# Patient Record
Sex: Female | Born: 1945 | Race: White | Hispanic: No | Marital: Married | Smoking: Never smoker
Health system: Southern US, Community
[De-identification: ages and names within clinical notes are randomized; demographics above are authoritative.]

## PROBLEM LIST (undated history)

## (undated) DIAGNOSIS — K219 Gastro-esophageal reflux disease without esophagitis: Secondary | ICD-10-CM

## (undated) DIAGNOSIS — K449 Diaphragmatic hernia without obstruction or gangrene: Secondary | ICD-10-CM

## (undated) DIAGNOSIS — L409 Psoriasis, unspecified: Secondary | ICD-10-CM

## (undated) DIAGNOSIS — M5136 Other intervertebral disc degeneration, lumbar region: Secondary | ICD-10-CM

## (undated) DIAGNOSIS — I10 Essential (primary) hypertension: Secondary | ICD-10-CM

## (undated) DIAGNOSIS — M51369 Other intervertebral disc degeneration, lumbar region without mention of lumbar back pain or lower extremity pain: Secondary | ICD-10-CM

---

## 1998-06-27 ENCOUNTER — Encounter: Payer: Self-pay | Admitting: Emergency Medicine

## 1998-06-27 ENCOUNTER — Emergency Department (HOSPITAL_COMMUNITY): Admission: EM | Admit: 1998-06-27 | Discharge: 1998-06-27 | Payer: Self-pay | Admitting: *Deleted

## 1998-09-28 ENCOUNTER — Emergency Department (HOSPITAL_COMMUNITY): Admission: EM | Admit: 1998-09-28 | Discharge: 1998-09-28 | Payer: Self-pay | Admitting: Emergency Medicine

## 1999-01-09 ENCOUNTER — Emergency Department (HOSPITAL_COMMUNITY): Admission: EM | Admit: 1999-01-09 | Discharge: 1999-01-09 | Payer: Self-pay | Admitting: Emergency Medicine

## 1999-02-17 ENCOUNTER — Emergency Department (HOSPITAL_COMMUNITY): Admission: EM | Admit: 1999-02-17 | Discharge: 1999-02-17 | Payer: Self-pay | Admitting: Emergency Medicine

## 1999-02-17 ENCOUNTER — Encounter: Payer: Self-pay | Admitting: Emergency Medicine

## 1999-02-26 ENCOUNTER — Emergency Department (HOSPITAL_COMMUNITY): Admission: EM | Admit: 1999-02-26 | Discharge: 1999-02-26 | Payer: Self-pay | Admitting: Emergency Medicine

## 1999-02-26 ENCOUNTER — Encounter: Payer: Self-pay | Admitting: Emergency Medicine

## 1999-03-01 ENCOUNTER — Encounter: Admission: RE | Admit: 1999-03-01 | Discharge: 1999-03-01 | Payer: Self-pay | Admitting: Internal Medicine

## 1999-03-09 ENCOUNTER — Encounter: Admission: RE | Admit: 1999-03-09 | Discharge: 1999-03-09 | Payer: Self-pay | Admitting: Internal Medicine

## 1999-03-19 ENCOUNTER — Encounter: Payer: Self-pay | Admitting: Emergency Medicine

## 1999-03-19 ENCOUNTER — Emergency Department (HOSPITAL_COMMUNITY): Admission: EM | Admit: 1999-03-19 | Discharge: 1999-03-19 | Payer: Self-pay | Admitting: Emergency Medicine

## 1999-04-09 ENCOUNTER — Encounter: Payer: Self-pay | Admitting: Gastroenterology

## 1999-04-09 ENCOUNTER — Ambulatory Visit (HOSPITAL_COMMUNITY): Admission: RE | Admit: 1999-04-09 | Discharge: 1999-04-09 | Payer: Self-pay | Admitting: Gastroenterology

## 2004-07-28 ENCOUNTER — Ambulatory Visit (HOSPITAL_COMMUNITY): Admission: RE | Admit: 2004-07-28 | Discharge: 2004-07-28 | Payer: Self-pay | Admitting: Gastroenterology

## 2004-07-28 ENCOUNTER — Encounter (INDEPENDENT_AMBULATORY_CARE_PROVIDER_SITE_OTHER): Payer: Self-pay | Admitting: Specialist

## 2005-08-25 ENCOUNTER — Emergency Department (HOSPITAL_COMMUNITY): Admission: EM | Admit: 2005-08-25 | Discharge: 2005-08-25 | Payer: Self-pay | Admitting: Emergency Medicine

## 2005-10-17 ENCOUNTER — Encounter: Admission: RE | Admit: 2005-10-17 | Discharge: 2005-10-17 | Payer: Self-pay | Admitting: Sports Medicine

## 2005-11-03 ENCOUNTER — Encounter: Admission: RE | Admit: 2005-11-03 | Discharge: 2005-11-03 | Payer: Self-pay | Admitting: Sports Medicine

## 2006-03-11 ENCOUNTER — Emergency Department (HOSPITAL_COMMUNITY): Admission: EM | Admit: 2006-03-11 | Discharge: 2006-03-12 | Payer: Self-pay | Admitting: Emergency Medicine

## 2006-03-15 ENCOUNTER — Emergency Department (HOSPITAL_COMMUNITY): Admission: EM | Admit: 2006-03-15 | Discharge: 2006-03-15 | Payer: Self-pay | Admitting: Emergency Medicine

## 2006-10-13 ENCOUNTER — Emergency Department (HOSPITAL_COMMUNITY): Admission: EM | Admit: 2006-10-13 | Discharge: 2006-10-14 | Payer: Self-pay | Admitting: Emergency Medicine

## 2006-10-18 ENCOUNTER — Ambulatory Visit: Payer: Self-pay | Admitting: Internal Medicine

## 2006-11-03 ENCOUNTER — Ambulatory Visit: Payer: Self-pay | Admitting: Internal Medicine

## 2006-11-17 ENCOUNTER — Ambulatory Visit: Payer: Self-pay | Admitting: Pulmonary Disease

## 2006-12-07 ENCOUNTER — Ambulatory Visit: Payer: Self-pay | Admitting: Internal Medicine

## 2006-12-08 ENCOUNTER — Ambulatory Visit: Payer: Self-pay | Admitting: Cardiology

## 2007-07-17 IMAGING — CR DG CHEST 2V
2 series · 2 of 2 positions shown · non-contrast
Comparison: none

CLINICAL DATA: 59-year-old female, cough.  Burning midchest. 
 CHEST - 2 VIEW:

[w chest pa]
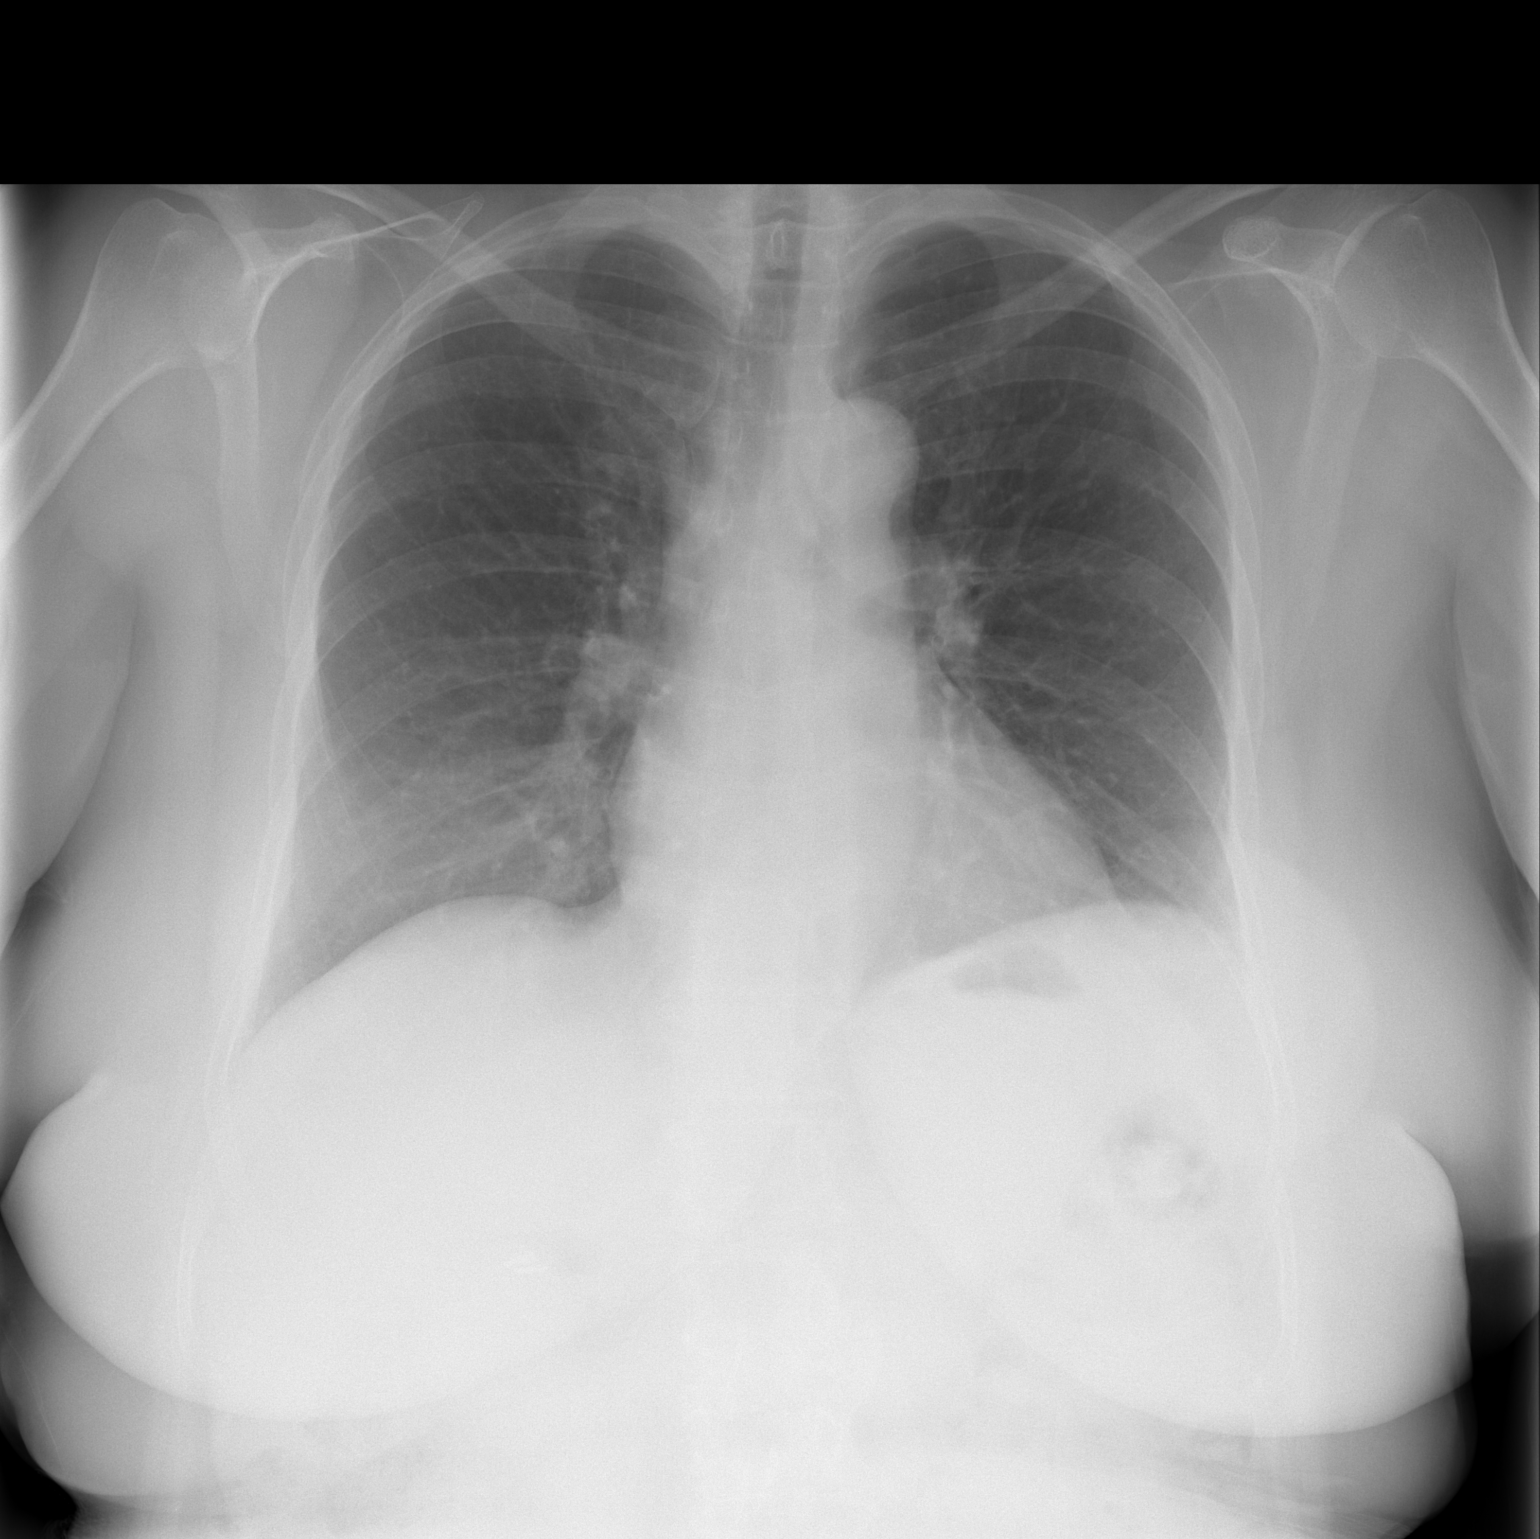

[w chest lat]
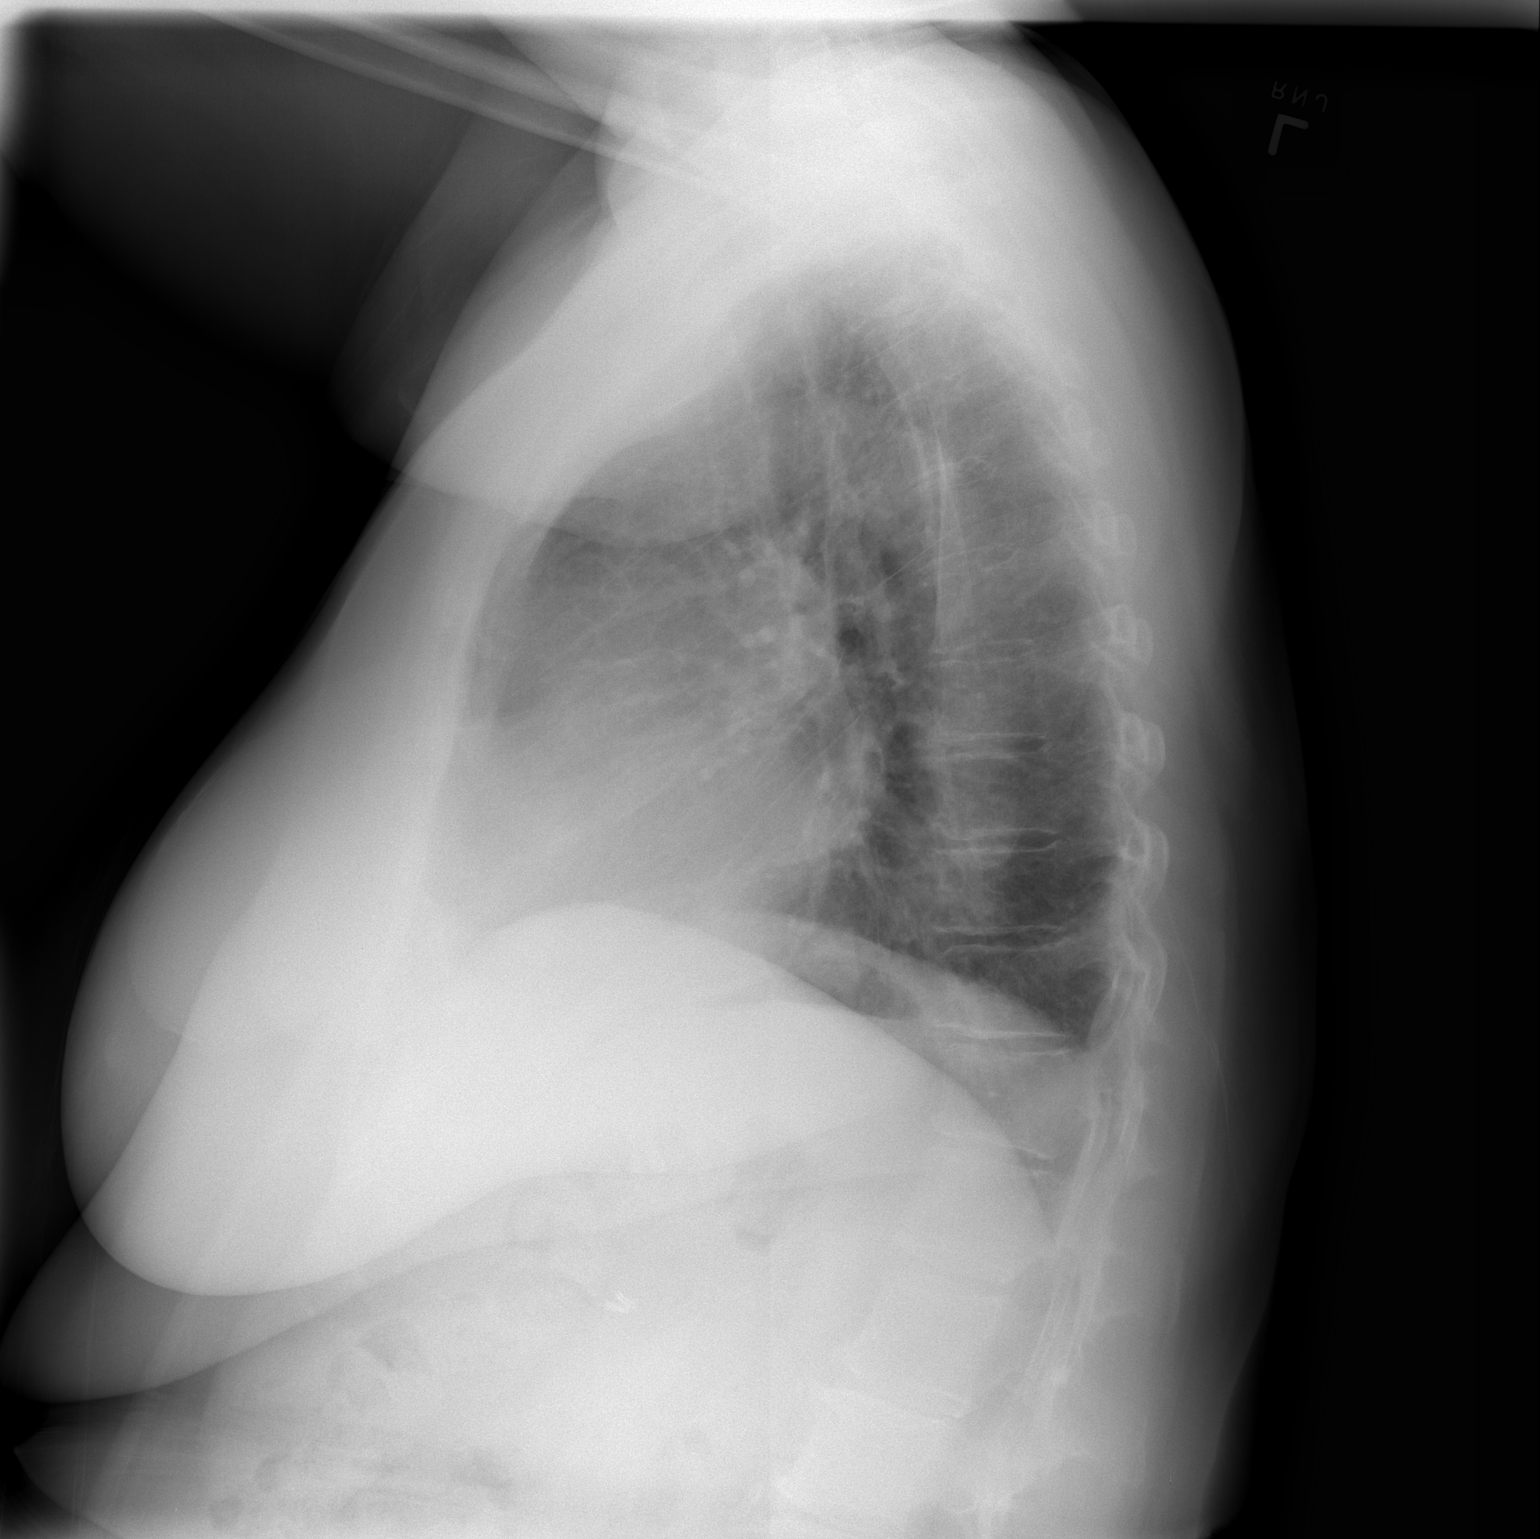

[2 of 2 positions shown; findings below may reference images not displayed]

FINDINGS: Cardiopericardial silhouette is within normal limits in size.  Lungs are clear.  The visualized soft tissues and bony thorax are unremarkable apart from mild degenerative changes of the thoracic spine.  
 Surgical clips are noted in the right upper quadrant.
IMPRESSION: No acute cardiopulmonary disease.

## 2010-11-16 NOTE — Assessment & Plan Note (Signed)
[facility]                             PULMONARY OFFICE NOTE   KYNZEE, DEVINNEY                      MRN:          [id number]  DATE:12/07/2006                            DOB:          11/04/1945    HISTORY OF PRESENT ILLNESS:  The patient is a 65 year old female patient  of Dr. Thurston Hole who has a known history of chronic cough.  The patient,  last visit, had been felt to have some vocal cord dysfunction with  persistent cough, wheezing and shortness of breath.  She had been  recommended to use Nexium twice a day, a Medrol Dosepak and cough  suppression regimen with Mucinex DM and Mepergan Forte.  However, she as  unable to tolerate the Mucinex and Mepergan or Medrol, only being able  to take a few days of this.  The patient has had no improvement in  symptoms.  The patient also has a history of psoriasis and  osteoarthritis.  The patient denies any fever, chest pain purulent  sputum or leg swelling.   PAST MEDICAL HISTORY:  Reviewed.   CURRENT MEDICATIONS:  Reviewed.   PHYSICAL EXAMINATION:  GENERAL:  The patient is a pleasant female in no  acute distress.  She is afebrile with stable vital signs.  Oxygen  saturation is 98% on room air.  HEENT:  Unremarkable.  NECK:  Supple without cervical adenopathy.  No JVD.  LUNGS:  Coarse breath sounds.  CARDIAC:  Regular rate.  ABDOMEN:  Soft and nontender.  EXTREMITIES:  Warm without any edema.  The patient does have significant  psoriasis along the upper extremities, especially at the elbows.   IMPRESSION/PLAN:  1. Recurrent cough, wheezing and shortness of breath.  The patient is      scheduled for CT of the sinuses along with labs with CBC with      differential, sed rate, BNP and C-met along with the RA factor.      The patient will return here next week with Dr. Sherene Sires to review      these CT scan and labs.  2. Complex medication regimen.  The patient's medication reviewed in      detail.   Patient      education provided.  A computerized medication calendar was      completed for this.  Patient reviewed in detail.  Patient aware to      bring this back to each and every visit.      Rubye Oaks, NP  Electronically Signed      Charlaine Dalton. Sherene Sires, MD, [facility]  Electronically Signed   TP/MedQ  DD: 12/07/2006  DT: 12/08/2006  Job #: [id number]

## 2010-11-16 NOTE — Assessment & Plan Note (Signed)
[facility]                             PULMONARY OFFICE NOTE   Eileen Nguyen, Eileen Nguyen                      MRN:          [id number]  DATE:11/03/2006                            DOB:          1946-02-15    This is a 65 year old woman who initially told me she was sick for 5 or  10 years. Turns out, more like 20 today, with a history of coughing,  wheezing and dyspnea that only improves with albuterol and frequently  exacerbates at night.  Interestingly, prednisone does no good.  She  described the cough previously as dry but since her last visit states it  has become productive of thick yellow sputum especially at night.   She had been seen on April 16 with atypical symptoms for which I felt  the upper way as the source with GERD but contemplated that possibly she  had asthma as well, based on how much her symptoms improved on  albuterol.  I introduced her to the use of Symbicort 80/4.5 2 puffs  b.i.d. and spent some time teaching her how to use it effectively (I  documented 50% effectiveness at the end of the training session).  She  comes back today stating she is no better and that she is short of  breath just walking 50 feet.   The one item I did not know on her previous visit is that she is  convinced she was on ACE inhibitors for antihypertensive. It turns out  she was on high doses of beta blockers in the form of Metoprolol as her  antihypertensive for which I asked her to decrease the Metoprolol 100 mg  tablets to 1/2 b.i.d. and return here today for followup.   PHYSICAL EXAMINATION:  She is a somber ambulatory white female with a  peculiar affect and attitude, has a difficult time answering questions  in a straight forward fashion and appears both helpless and hopeless  regarding her problems.  HEENT:  Unremarkable. Pharynx is clear.  NECK:  Supple without cervical adenopathy or tenderness. Trachea is  midline, no thyromegaly.  LUNG  FIELDS:  Reveal pseudo wheeze only.  CARDIAC:  There is a regular rate and rhythm without murmur, gallop,  rub.  ABDOMEN:  Soft, benign.  EXTREMITIES:  Warm without calf tenderness, no clubbing, cyanosis or  edema.   A chest x-ray was reviewed from October 14, 2006 and is normal.   IMPRESSION:  1. Vocal cord dysfunction of unclear etiology (differential diagnosis      includes sinusitis, reflux and functional, with functional being      much more likely given the fact that she now says she has had the      problem for over 20 years) and it never gets better except with      albuterol and it only gets better some then.  The first thing I      did was to make sure she could actually use albuterol and found out      that she probably inhales only 10% of the medicine, most of it  ending up in the mouth.   Therefore I am less convinced now than I was previously that asthma is  the diagnosis and recommended the following specifics today, spending 25  minutes with her going over information in a carefully texted written  format.   1. Change Metoprolol to Bystolic 10 mg one daily on the basis of the      possibility that high does of Metoprolol are causing nocturnal      dyspnea/wheeze  (that remains to be seen but certainly I have the samples to give her a  therapeutic trial for this and it is worth attempting).  1. Nexium 40 mg tablets 30 minutes a.c. b.i.d.  2. Medrol 4 mg 4 daily until 100% better, then 2 daily x3 days, then 1      daily x3 days and stop.  3. For dyspnea I want her to use albuterol in the form of Pro Air and      spent extra time teaching her how to use it to better effect (only      approaching 50% effectiveness at the end of teaching session      however).  4. For cough and congestion I want her to use Mucinex DM 1-2 q.12      hours, if still coughing add Mepergan one q.4 hours.   Since she does have purulent sputum now,  sinusitis is also a  possibility, so I  have recommended Augmentin 875 b.i.d. x10 days and  then for cough or sinus, CT scan if not convinced that she is improving  in terms of the purulence of the sputum.   If at any point her condition worsens during the next 2 weeks I have  offered to put her in the hospital since I still do not have a firm  handle on the nature of this illness going back 20 years, and it seems  to be if anything, getting worse with empiric treatment directed at  asthma.     Charlaine Dalton. Sherene Sires, MD, [facility]  Electronically Signed    MBW/MedQ  DD: 11/04/2006  DT: 11/04/2006  Job #: [id number]   cc:   Windle Guard, M.D.

## 2010-11-16 NOTE — Assessment & Plan Note (Signed)
 [facility]                             PULMONARY OFFICE NOTE   AVAYA, MCJUNKINS                      MRN:          [id number]  DATE:11/17/2006                            DOB:          04/14/1946    HISTORY OF PRESENT ILLNESS:  The patient is a 65 year old, white female  patient of Dr. Sherene Sires who returns today for a 2-week followup.  The  patient was recently seen by Dr. Sherene Sires for pulmonary consult with  persistent cough and wheezing.  At last visit, the patient was  recommended to change Metoprolol over to Bistolic and to increase Nexium  up to twice a day.  The patient was started on a Medrol dose pack and  then recommended to use for cough and congestion Mucinex DM with  Mepergan Forte to be used every 4 hours for breakthrough coughing.  She  was also given a 10-day course of Augmentin.  The patient returns today  with only minimal improvement in symptoms.  The patient did become a  little confused with the above medication recommendations.  The patient  does have 2 days left of her Augmentin and did not take the Medrol  except for 4 mg a day.  The patient also has not used any Mepergan or  Mucinex DM for cough control.  The patient denies any chest pain,  orthopnea, PND or leg swelling.   PAST MEDICAL HISTORY:  Reviewed.   CURRENT MEDICATIONS:  Reviewed.   PHYSICAL EXAMINATION:  GENERAL:  The patient is a pleasant female in no  acute distress.  VITAL SIGNS:  Afebrile with stable vital signs.  O2 saturation 96% on  room air.  HEENT:  Nasal mucosa pale with some nonspecific turbinate edema.  Posterior pharynx is clear.  NECK:  Supple without cervical adenopathy.  No JVD.  LUNGS:  Lung sounds reveal course breath sounds bilaterally with no  wheeze or crackles.  CARDIAC:  Regular rate.  ABDOMEN:  Soft and nontender.  EXTREMITIES:  Without any edema.   ASSESSMENT/PLAN:  Vocal cord dysfunction secondary to possible  underlying sinusitis  and/or reflux possibly.  The patient will finish  Augmentin as recommended.  Will restart a Medrol dose pack beginning  with 16 mg x3 days, decreasing by 4 mg every 3 days and then stop.  The  patient will also add in Mucinex DM twice daily and may use Mepergan  Forte for breakthrough coughing.  She will return here in 1 week for a  complete medication review in which she will bring all of her  medications and we will give her a computerized medication list to help  her with  her difficult medication list.  The patient is recommended to contact us  for sooner followup if needed.      Rubye Oaks, NP  Electronically Signed      Charlaine Dalton. Sherene Sires, MD, [facility]  Electronically Signed   TP/MedQ  DD: 11/20/2006  DT: 11/21/2006  Job #: [id number]

## 2010-11-19 NOTE — Op Note (Signed)
NAMEPAVNEET, MARKWOOD NO.:  [id number]   MEDICAL RECORD NO.:  [id number]          PATIENT TYPE:  AMB   LOCATION:  ENDO                         FACILITY:  [facility]   PHYSICIAN:  Anselmo Rod, M.D.  DATE OF BIRTH:  [birth year]   DATE OF PROCEDURE:  07/28/2004  DATE OF DISCHARGE:                                 OPERATIVE REPORT   PROCEDURE PERFORMED:  Colonoscopy with cold biopsies x 1.   ENDOSCOPIST:  Anselmo Rod, M.D.   INSTRUMENT USED:  Olympus video colonoscope.   INDICATIONS FOR PROCEDURE:  A 65 year old white female undergoing screening  colonoscopy to rule out colonic polyps, masses, etc.  Patient has a personal  history of colonic polyps.   PREPROCEDURE PREPARATION:  Informed consent was procured from the patient.  The patient fasted for 8 hours prior to the procedure and prepped with a  bottle of magnesium citrate and a gallon of GoLYTELY the night prior to the  procedure.  Risks and benefits of the procedure including a 10% missed rate  of cancer and polyps were discussed with her as well.   PREPROCEDURE PHYSICAL EXAMINATION:  VITAL SIGNS:  The patient with stable  vital signs.  NECK:  Supple.  CHEST:  Clear to auscultation.  CARDIAC:  S1, S2, regular.  ABDOMEN:  Soft with normal bowel sounds.   DESCRIPTION OF PROCEDURE:  The patient was placed in the left lateral  decubitus position, sedated with 70 mg of Demerol and 7 mg of Versed in  slow, incremental doses.  Once the patient was adequately sedated and  maintained on low-flow oxygen and continuous cardiac monitoring, the Olympus  video colonoscope was advanced from the rectum to the cecum.  The  appendiceal orifice and ileocecal valve were visualized and photographed.  A  small sessile polyp was biopsied from 90 cm.  Small internal hemorrhoids  were seen on retroflexion.  The patient tolerated the procedure well without  complications.   IMPRESSION:  1.  Small, nonbleeding internal  hemorrhoids.  2.  Small sessile polyp, biopsied from 90 cm.  3.  No evidence of diverticulosis.  4.  No large masses or polyps seen.   RECOMMENDATIONS:  1.  Await pathology results.  2.  Avoid nonsteroidals, including aspirin for the next 2 weeks.  3.  Repeat colonoscopy depending on colonoscopy results.  4.  Outpatient follow up as need arises in the future.      JNM/MEDQ  D:  07/28/2004  T:  07/28/2004  Job:  [id number]   cc:   Richardean Sale, M.D.  [address]  [place]  [place] [address]  Fax: [phone]

## 2010-11-19 NOTE — Assessment & Plan Note (Signed)
[facility]                             PULMONARY OFFICE NOTE   SIEARA, BREMER                      MRN:          [id number]  DATE:10/18/2006                            DOB:          22-Oct-1945    CHIEF COMPLAINT:  Coughing and wheezing.   HISTORY:  A 65 year old, white female states she has been having trouble  that comes and goes for 5 or 10 years. From that point, the history  went down hill in terms of anything specific that bothered her. As the  best I understood it, she has been having a sensation of chest tightness  associated with cough but no significant dyspnea that comes and goes  without any pattern. It goes away for a month and then comes back for a  month and then goes away without any identifiably relieving medicine. On  the other hand, albuterol helps a lot.   She has already been seen by Dr. Ezzard Standing during one of her bad spells  and it was reported that there were no abnormalities. The pattern does  occur at night while sleeping. In fact, she took her last dose of  albuterol at 4:00 in the morning. She had been on recent prednisone and  says it did not do any good.  The patient denies any significant  dyspnea, pleuritic pain, fevers, chills, sweats, orthopnea, PND or leg  swelling. She does have chronic acid reflux symptoms and correlates this  with her complaints and denies any typical seasonal or weather related  triggers.   PAST MEDICAL HISTORY:  Significant for low back pain. She has a history  of GERD requiring esophageal stretching although does not know who did  it or how long ago it was.   ALLERGIES:  CODEINE.   MEDICATIONS:  Metoprolol 100 mg daily. Glucosamine, fish oil, Nexium,  Lidoderm and Lyrica.   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   FAMILY HISTORY:  Positive for heart disease in her father, negative  for  respiratory diseases or atopy.   REVIEW OF SYSTEMS:  Taken in detail on the worksheet and negative except  as outlined above.   PHYSICAL EXAMINATION:  GENERAL:  This is a pleasant if extremely  evasive, ambulatory, white female in no acute distress who did not  answer a single question I asked her in a straight forward manner.  VITAL SIGNS:  She had stable vital signs.  HEENT:  Unremarkable. Pharynx clear.  NECK:  Supple without cervical adenopathy or tenderness. The trachea is  midline, no thyromegaly.  LUNGS:  Lung fields perfectly clear bilaterally to auscultation and  percussion.  HEART:  There is a regular rate and rhythm without murmur, gallop or  rub.  ABDOMEN:  Soft and benign.  EXTREMITIES:  Warm without calf tenderness, cyanosis, clubbing or edema.   IMPRESSION:  Symptoms dating back 10 years that are extremely vague in  nature but do improve with albuterol though she does not consider it a  medication and initially gave it no credit at all for helping her.  Since she is so convinced she is better from it, I am going to recommend  a trial of Symbicort 80/4.5 two puffs b.i.d. and spent extra time making  sure she could use metered dose inhaler effectively (she was only  however about 50% effective at the end of the training session).   What this symptom sounds more like to me is upper airway dysfunction  masquerading as asthma and a unifying diagnosis may be poorly controlled  reflux, although I do note that she is taking Nexium pretty  consistently.   It does turn out that reflux can cause both atypical and typical asthma  and therefore I recommended the following:   1. I reviewed with her an optimal gastroesophageal reflux disease      diet.  2. Take Nexium 30-60 minutes before both breakfast and supper just for      the next several weeks. If she is wheezing, use albuterol 2 puffs      every 4 hours, if coughing and congestion use Delsym 2 teaspoons       every 12 hours and if still having cough or discomfort related to      cough use Tramadol 50 mg 1 every 4 hours.   Because her symptoms seem so difficult to get a handle on, I have  recommended return here within 2 weeks and would consider switching from  nonspecific metoprolol to more specific Bystolic in terms of treating  her blood pressure, especially if high dose beta blocker is required,  and eliminating fish oil completely since oil is probably the second  worst thing she could refluxing after acid.   Chest x-ray is reviewed for October 14, 2006 and is normal.     Casimiro Needle B. Sherene Sires, MD, [facility]  Electronically Signed    MBW/MedQ  DD: 10/18/2006  DT: 10/19/2006  Job #: [id number]   cc:   Windle Guard, M.D.

## 2011-03-11 ENCOUNTER — Emergency Department (HOSPITAL_COMMUNITY): Payer: Self-pay

## 2011-03-11 ENCOUNTER — Emergency Department (HOSPITAL_COMMUNITY)
Admission: EM | Admit: 2011-03-11 | Discharge: 2011-03-11 | Disposition: A | Discharge status: Home / Self Care | Payer: Self-pay | Attending: Emergency Medicine | Admitting: Emergency Medicine

## 2011-03-11 DIAGNOSIS — M79609 Pain in unspecified limb: Secondary | ICD-10-CM | POA: Insufficient documentation

## 2011-03-11 DIAGNOSIS — I10 Essential (primary) hypertension: Secondary | ICD-10-CM | POA: Insufficient documentation

## 2011-03-11 DIAGNOSIS — S61409A Unspecified open wound of unspecified hand, initial encounter: Secondary | ICD-10-CM | POA: Insufficient documentation

## 2011-03-11 DIAGNOSIS — IMO0001 Reserved for inherently not codable concepts without codable children: Secondary | ICD-10-CM | POA: Insufficient documentation

## 2011-05-04 ENCOUNTER — Emergency Department (HOSPITAL_COMMUNITY): Payer: Self-pay

## 2011-05-04 ENCOUNTER — Emergency Department (HOSPITAL_COMMUNITY)
Admission: EM | Admit: 2011-05-04 | Discharge: 2011-05-05 | Disposition: A | Discharge status: Home / Self Care | Payer: Self-pay | Attending: Emergency Medicine | Admitting: Emergency Medicine

## 2011-05-04 DIAGNOSIS — R51 Headache: Secondary | ICD-10-CM | POA: Insufficient documentation

## 2011-05-04 DIAGNOSIS — R21 Rash and other nonspecific skin eruption: Secondary | ICD-10-CM | POA: Insufficient documentation

## 2011-05-04 DIAGNOSIS — R1310 Dysphagia, unspecified: Secondary | ICD-10-CM | POA: Insufficient documentation

## 2011-05-04 DIAGNOSIS — R000 Tachycardia, unspecified: Secondary | ICD-10-CM | POA: Insufficient documentation

## 2011-05-04 DIAGNOSIS — R1013 Epigastric pain: Secondary | ICD-10-CM | POA: Insufficient documentation

## 2011-05-04 DIAGNOSIS — I10 Essential (primary) hypertension: Secondary | ICD-10-CM | POA: Insufficient documentation

## 2011-05-04 DIAGNOSIS — R110 Nausea: Secondary | ICD-10-CM | POA: Insufficient documentation

## 2011-05-04 DIAGNOSIS — R079 Chest pain, unspecified: Secondary | ICD-10-CM | POA: Insufficient documentation

## 2011-05-04 LAB — URINALYSIS, ROUTINE W REFLEX MICROSCOPIC
Bilirubin Urine: NEGATIVE
Glucose, UA: NEGATIVE mg/dL
Ketones, ur: NEGATIVE mg/dL
Nitrite: NEGATIVE
Protein, ur: NEGATIVE mg/dL
Specific Gravity, Urine: 1.013 (ref 1.005–1.030)
Urobilinogen, UA: 0.2 mg/dL (ref 0.0–1.0)
pH: 7 (ref 5.0–8.0)

## 2011-05-04 LAB — CBC
HCT: 40.6 % (ref 36.0–46.0)
Hemoglobin: 13.6 g/dL (ref 12.0–15.0)
MCH: 29.2 pg (ref 26.0–34.0)
MCHC: 33.5 g/dL (ref 30.0–36.0)
MCV: 87.3 fL (ref 78.0–100.0)
Platelets: 236 10*3/uL (ref 150–400)
RBC: 4.65 MIL/uL (ref 3.87–5.11)
RDW: 13.6 % (ref 11.5–15.5)
WBC: 6.8 10*3/uL (ref 4.0–10.5)

## 2011-05-04 LAB — URINE MICROSCOPIC-ADD ON

## 2011-05-04 LAB — COMPREHENSIVE METABOLIC PANEL
ALT: 11 U/L (ref 0–35)
AST: 17 U/L (ref 0–37)
Albumin: 3.5 g/dL (ref 3.5–5.2)
Alkaline Phosphatase: 78 U/L (ref 39–117)
BUN: 10 mg/dL (ref 6–23)
CO2: 25 mEq/L (ref 19–32)
Calcium: 9.6 mg/dL (ref 8.4–10.5)
Chloride: 104 mEq/L (ref 96–112)
Creatinine, Ser: 0.64 mg/dL (ref 0.50–1.10)
GFR calc Af Amer: >90 mL/min (ref >90)
GFR calc non Af Amer: >90 mL/min (ref >90)
Glucose, Bld: 85 mg/dL (ref 70–99)
Potassium: 3.4 mEq/L — ABNORMAL LOW (ref 3.5–5.1)
Sodium: 139 mEq/L (ref 135–145)
Total Bilirubin: 0.3 mg/dL (ref 0.3–1.2)
Total Protein: 6.9 g/dL (ref 6.0–8.3)

## 2011-05-04 LAB — POCT I-STAT TROPONIN I: Troponin i, poc: 0 ng/mL (ref 0.00–0.08)

## 2011-05-04 LAB — DIFFERENTIAL
Basophils Absolute: 0 10*3/uL (ref 0.0–0.1)
Basophils Relative: 0 % (ref 0–1)
Eosinophils Absolute: 0.3 10*3/uL (ref 0.0–0.7)
Eosinophils Relative: 4 % (ref 0–5)
Lymphocytes Relative: 26 % (ref 12–46)
Lymphs Abs: 1.8 10*3/uL (ref 0.7–4.0)
Monocytes Absolute: 0.6 10*3/uL (ref 0.1–1.0)
Monocytes Relative: 9 % (ref 3–12)
Neutro Abs: 4.2 10*3/uL (ref 1.7–7.7)
Neutrophils Relative %: 61 % (ref 43–77)

## 2011-05-04 LAB — D-DIMER, QUANTITATIVE: D-Dimer, Quant: 0.41 ug/mL-FEU (ref 0.00–0.48)

## 2011-05-04 LAB — LIPASE, BLOOD: Lipase: 29 U/L (ref 11–59)

## 2011-05-04 MED ORDER — IOHEXOL 300 MG/ML  SOLN
100.0000 mL | Freq: Once | INTRAMUSCULAR | Status: AC | PRN
  Administered 2011-05-04: 80 mL via INTRAVENOUS

## 2011-07-27 ENCOUNTER — Emergency Department (HOSPITAL_COMMUNITY): Payer: Medicare Other

## 2011-07-27 ENCOUNTER — Emergency Department (HOSPITAL_COMMUNITY)
Admission: EM | Admit: 2011-07-27 | Discharge: 2011-07-27 | Disposition: A | Discharge status: Home / Self Care | Payer: Medicare Other | Attending: Emergency Medicine | Admitting: Emergency Medicine

## 2011-07-27 DIAGNOSIS — R1032 Left lower quadrant pain: Secondary | ICD-10-CM | POA: Insufficient documentation

## 2011-07-27 DIAGNOSIS — N200 Calculus of kidney: Secondary | ICD-10-CM

## 2011-07-27 LAB — URINALYSIS, ROUTINE W REFLEX MICROSCOPIC
Bilirubin Urine: NEGATIVE
Glucose, UA: NEGATIVE mg/dL
Ketones, ur: NEGATIVE mg/dL
Nitrite: NEGATIVE
Protein, ur: NEGATIVE mg/dL
Specific Gravity, Urine: 1.015 (ref 1.005–1.030)
Urobilinogen, UA: 0.2 mg/dL (ref 0.0–1.0)
pH: 6.5 (ref 5.0–8.0)

## 2011-07-27 LAB — COMPREHENSIVE METABOLIC PANEL
ALT: 13 U/L (ref 0–35)
AST: 16 U/L (ref 0–37)
Albumin: 3.6 g/dL (ref 3.5–5.2)
Alkaline Phosphatase: 86 U/L (ref 39–117)
BUN: 14 mg/dL (ref 6–23)
CO2: 25 mEq/L (ref 19–32)
Calcium: 9.6 mg/dL (ref 8.4–10.5)
Chloride: 104 mEq/L (ref 96–112)
Creatinine, Ser: 0.82 mg/dL (ref 0.50–1.10)
GFR calc Af Amer: 85 mL/min — ABNORMAL LOW (ref >90)
GFR calc non Af Amer: 73 mL/min — ABNORMAL LOW (ref >90)
Glucose, Bld: 133 mg/dL — ABNORMAL HIGH (ref 70–99)
Potassium: 3.7 mEq/L (ref 3.5–5.1)
Sodium: 138 mEq/L (ref 135–145)
Total Bilirubin: 0.2 mg/dL — ABNORMAL LOW (ref 0.3–1.2)
Total Protein: 7 g/dL (ref 6.0–8.3)

## 2011-07-27 LAB — CBC
HCT: 41.4 % (ref 36.0–46.0)
Hemoglobin: 13.6 g/dL (ref 12.0–15.0)
MCH: 28.9 pg (ref 26.0–34.0)
MCHC: 32.9 g/dL (ref 30.0–36.0)
MCV: 88.1 fL (ref 78.0–100.0)
Platelets: 196 10*3/uL (ref 150–400)
RBC: 4.7 MIL/uL (ref 3.87–5.11)
RDW: 12.9 % (ref 11.5–15.5)
WBC: 6.4 10*3/uL (ref 4.0–10.5)

## 2011-07-27 LAB — URINE MICROSCOPIC-ADD ON

## 2011-07-27 LAB — LIPASE, BLOOD: Lipase: 45 U/L (ref 11–59)

## 2011-07-27 MED ORDER — ONDANSETRON HCL 4 MG PO TABS: 4.0000 mg | ORAL_TABLET | Freq: Four times a day (QID) | ORAL | Status: AC

## 2011-07-27 MED ORDER — CIPROFLOXACIN HCL 500 MG PO TABS: 500.0000 mg | ORAL_TABLET | Freq: Two times a day (BID) | ORAL | Status: AC

## 2011-07-27 MED ORDER — HYDROCODONE-ACETAMINOPHEN 5-500 MG PO TABS: 1.0000 | ORAL_TABLET | Freq: Four times a day (QID) | ORAL | Status: AC | PRN

## 2011-07-27 MED ORDER — SODIUM CHLORIDE 0.9 % IV SOLN
INTRAVENOUS | Status: DC
  Administered 2011-07-27: 06:00:00 via INTRAVENOUS

## 2011-07-27 MED ORDER — HYDROMORPHONE HCL PF 1 MG/ML IJ SOLN
1.0000 mg | Freq: Once | INTRAMUSCULAR | Status: AC
  Administered 2011-07-27: 1 mg via INTRAVENOUS
  Filled 2011-07-27: qty 1

## 2011-07-27 MED ORDER — ONDANSETRON HCL 4 MG/2ML IJ SOLN
4.0000 mg | Freq: Once | INTRAMUSCULAR | Status: AC
  Administered 2011-07-27: 4 mg via INTRAVENOUS

## 2011-07-27 MED ORDER — ONDANSETRON HCL 4 MG/2ML IJ SOLN
INTRAMUSCULAR | Status: AC
  Administered 2011-07-27: 4 mg via INTRAVENOUS
  Filled 2011-07-27: qty 2

## 2011-07-27 MED ORDER — IOHEXOL 300 MG/ML  SOLN
100.0000 mL | Freq: Once | INTRAMUSCULAR | Status: AC | PRN
  Administered 2011-07-27: 100 mL via INTRAVENOUS

## 2011-07-27 MED ORDER — PROMETHAZINE HCL 25 MG/ML IJ SOLN
12.5000 mg | Freq: Once | INTRAMUSCULAR | Status: AC
  Administered 2011-07-27: 12.5 mg via INTRAVENOUS
  Filled 2011-07-27: qty 1

## 2011-07-27 MED ORDER — ONDANSETRON HCL 4 MG/2ML IJ SOLN
4.0000 mg | Freq: Once | INTRAMUSCULAR | Status: AC
  Administered 2011-07-27: 4 mg via INTRAVENOUS
  Filled 2011-07-27: qty 2

## 2011-07-27 NOTE — ED Provider Notes (Signed)
Results for orders placed during the hospital encounter of 07/27/11  CBC      Component Value Range   WBC 6.4  4.0 - 10.5 (K/uL)   RBC 4.70  3.87 - 5.11 (MIL/uL)   Hemoglobin 13.6  12.0 - 15.0 (g/dL)   HCT 47.8  29.5 - 62.1 (%)   MCV 88.1  78.0 - 100.0 (fL)   MCH 28.9  26.0 - 34.0 (pg)   MCHC 32.9  30.0 - 36.0 (g/dL)   RDW 30.8  65.7 - 84.6 (%)   Platelets 196  150 - 400 (K/uL)  COMPREHENSIVE METABOLIC PANEL      Component Value Range   Sodium 138  135 - 145 (mEq/L)   Potassium 3.7  3.5 - 5.1 (mEq/L)   Chloride 104  96 - 112 (mEq/L)   CO2 25  19 - 32 (mEq/L)   Glucose, Bld 133 (*) 70 - 99 (mg/dL)   BUN 14  6 - 23 (mg/dL)   Creatinine, Ser 9.62  0.50 - 1.10 (mg/dL)   Calcium 9.6  8.4 - 95.2 (mg/dL)   Total Protein 7.0  6.0 - 8.3 (g/dL)   Albumin 3.6  3.5 - 5.2 (g/dL)   AST 16  0 - 37 (U/L)   ALT 13  0 - 35 (U/L)   Alkaline Phosphatase 86  39 - 117 (U/L)   Total Bilirubin 0.2 (*) 0.3 - 1.2 (mg/dL)   GFR calc non Af Amer 73 (*) >90 (mL/min)   GFR calc Af Amer 85 (*) >90 (mL/min)  URINALYSIS, ROUTINE W REFLEX MICROSCOPIC      Component Value Range   Color, Urine YELLOW  YELLOW    APPearance CLEAR  CLEAR    Specific Gravity, Urine 1.015  1.005 - 1.030    pH 6.5  5.0 - 8.0    Glucose, UA NEGATIVE  NEGATIVE (mg/dL)   Hgb urine dipstick SMALL (*) NEGATIVE    Bilirubin Urine NEGATIVE  NEGATIVE    Ketones, ur NEGATIVE  NEGATIVE (mg/dL)   Protein, ur NEGATIVE  NEGATIVE (mg/dL)   Urobilinogen, UA 0.2  0.0 - 1.0 (mg/dL)   Nitrite NEGATIVE  NEGATIVE    Leukocytes, UA SMALL (*) NEGATIVE   LIPASE, BLOOD      Component Value Range   Lipase 45  11 - 59 (U/L)  URINE MICROSCOPIC-ADD ON      Component Value Range   WBC, UA 3-6  <3 (WBC/hpf)   RBC / HPF 3-6  <3 (RBC/hpf)   Bacteria, UA RARE  RARE    Ct Abdomen Pelvis W Contrast  07/27/2011  *RADIOLOGY REPORT*  Clinical Data: Left lower quadrant pain.  CT ABDOMEN AND PELVIS WITH CONTRAST  Technique:  Multidetector CT imaging of the  abdomen and pelvis was performed following the standard protocol during bolus administration of intravenous contrast.  Contrast: OMNIPAQUE IOHEXOL 300 MG/ML IV SOLN, OMNIPAQUE IOHEXOL 300 MG/ML IV SOLN 05/04/2011  Comparison: 05/04/2011  Findings: There is a moderate sized hiatal hernia.  Lung bases are clear.  No effusions.  Heart is normal size.  Scattered small low-density lesions throughout the liver, most compatible with small cysts.  Prior cholecystectomy.  Spleen, pancreas, adrenals are unremarkable.  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 Fullness in the left renal collecting system has increased slightly since prior study.  In addition, there is stranding noted in the left retroperitoneum adjacent to the left ureter.  Small calcification layers dependently  in the bladder on image 77.  I suspect the constellation of findings represents a recently passed stone with either left forniceal or ureteral rupture.  Small posterior bladder wall diverticulum, stable.  IMPRESSION: Small stone layering dependently in the bladder.  There is a fluid/stranding in the left retroperitoneum adjacent to the mid left ureter and extending superiorly near the left side of the kidney, most likely related to forniceal or ureteral rupture related to the recently passed stone.  Slight fullness of the left renal collecting system.  Horseshoe kidney.  Moderate hiatal hernia.  Small cyst in the liver.  Original Report Authenticated By: Cyndie Chime, M.D.    Pt with likely recently passed stone, possible forniceal rupture.  Discussed with Dr. Sherron Monday with [facility].  He advised we can discharge pt, start on abx, culture urine.  F/u with them.  Pt is feeling much better, ready to home.  Advised to return for any worsening pain, vomiting, fever.   Rolan Bucco, MD 07/27/11 1112

## 2011-07-27 NOTE — ED Provider Notes (Signed)
History     CSN: [id number]  Arrival date & time 07/27/11  4098   First MD Initiated Contact with Patient 07/27/11 [phone]      No chief complaint on file.   (Consider location/radiation/quality/duration/timing/severity/associated sxs/prior treatment) The history is provided by the patient.   location left lower quadrant. Radiation to left lower back. Quality is sharp. Duration constant since onset last night. Moderate to severe. Has associated nausea and vomiting. No fevers or chills. No diarrhea or constipation and no blood in stools. No bloody or bilious emesis. No history of same. Patient does have a history of kidney stones states this does not feel the same. No hematuria. Has remote history of colonoscopy and was told she has diverticulosis.  No past medical history on file.  No past surgical history on file.  No family history on file.  History  Substance Use Topics  . Smoking status: Not on file  . Smokeless tobacco: Not on file  . Alcohol Use: Not on file    OB History    No data available      Review of Systems  Constitutional: Negative for fever and chills.  HENT: Negative for neck pain and neck stiffness.   Eyes: Negative for pain.  Respiratory: Negative for shortness of breath.   Cardiovascular: Negative for chest pain.  Gastrointestinal: Positive for abdominal pain. Negative for blood in stool and abdominal distention.  Genitourinary: Negative for dysuria, urgency, frequency and flank pain.  Musculoskeletal: Negative for back pain.  Skin: Negative for rash.  Neurological: Negative for headaches.  All other systems reviewed and are negative.    Allergies  Review of patient's allergies indicates no known allergies.  Home Medications  No current outpatient prescriptions on file.  BP 178/87  Pulse 100  Resp 22  SpO2 100%  Physical Exam  Constitutional: She is oriented to person, place, and time. She appears well-developed and well-nourished.  HENT:   Head: Normocephalic and atraumatic.  Eyes: Conjunctivae and EOM are normal. Pupils are equal, round, and reactive to light.  Neck: Trachea normal. Neck supple. No thyromegaly present.  Cardiovascular: Normal rate, regular rhythm, S1 normal, S2 normal and normal pulses.     No systolic murmur is present   No diastolic murmur is present  Pulses:      Radial pulses are 2+ on the right side, and 2+ on the left side.  Pulmonary/Chest: Effort normal and breath sounds normal. She has no wheezes. She has no rhonchi. She has no rales. She exhibits no tenderness.  Abdominal: Soft. Normal appearance and bowel sounds are normal. She exhibits no mass. There is tenderness. There is no rebound, no CVA tenderness and negative Murphy's sign.       Tender left lower quadrant with voluntary guarding  Musculoskeletal:       BLE:s Calves nontender, no cords or erythema, negative Homans sign  Neurological: She is alert and oriented to person, place, and time. She has normal strength. No cranial nerve deficit or sensory deficit. GCS eye subscore is 4. GCS verbal subscore is 5. GCS motor subscore is 6.  Skin: Skin is warm and dry. No rash noted. She is not diaphoretic.  Psychiatric: Her speech is normal.       Cooperative and appropriate    ED Course  Procedures (including critical care time)  Results for orders placed during the hospital encounter of 07/27/11  CBC      Component Value Range   WBC 6.4  4.0 -  10.5 (K/uL)   RBC 4.70  3.87 - 5.11 (MIL/uL)   Hemoglobin 13.6  12.0 - 15.0 (g/dL)   HCT 96.0  45.4 - 09.8 (%)   MCV 88.1  78.0 - 100.0 (fL)   MCH 28.9  26.0 - 34.0 (pg)   MCHC 32.9  30.0 - 36.0 (g/dL)   RDW 11.9  14.7 - 82.9 (%)   Platelets 196  150 - 400 (K/uL)  COMPREHENSIVE METABOLIC PANEL      Component Value Range   Sodium 138  135 - 145 (mEq/L)   Potassium 3.7  3.5 - 5.1 (mEq/L)   Chloride 104  96 - 112 (mEq/L)   CO2 25  19 - 32 (mEq/L)   Glucose, Bld 133 (*) 70 - 99 (mg/dL)   BUN 14   6 - 23 (mg/dL)   Creatinine, Ser 5.62  0.50 - 1.10 (mg/dL)   Calcium 9.6  8.4 - 13.0 (mg/dL)   Total Protein 7.0  6.0 - 8.3 (g/dL)   Albumin 3.6  3.5 - 5.2 (g/dL)   AST 16  0 - 37 (U/L)   ALT 13  0 - 35 (U/L)   Alkaline Phosphatase 86  39 - 117 (U/L)   Total Bilirubin 0.2 (*) 0.3 - 1.2 (mg/dL)   GFR calc non Af Amer 73 (*) >90 (mL/min)   GFR calc Af Amer 85 (*) >90 (mL/min)  URINALYSIS, ROUTINE W REFLEX MICROSCOPIC      Component Value Range   Color, Urine YELLOW  YELLOW    APPearance CLEAR  CLEAR    Specific Gravity, Urine 1.015  1.005 - 1.030    pH 6.5  5.0 - 8.0    Glucose, UA NEGATIVE  NEGATIVE (mg/dL)   Hgb urine dipstick SMALL (*) NEGATIVE    Bilirubin Urine NEGATIVE  NEGATIVE    Ketones, ur NEGATIVE  NEGATIVE (mg/dL)   Protein, ur NEGATIVE  NEGATIVE (mg/dL)   Urobilinogen, UA 0.2  0.0 - 1.0 (mg/dL)   Nitrite NEGATIVE  NEGATIVE    Leukocytes, UA SMALL (*) NEGATIVE   LIPASE, BLOOD      Component Value Range   Lipase 45  11 - 59 (U/L)  URINE MICROSCOPIC-ADD ON      Component Value Range   WBC, UA 3-6  <3 (WBC/hpf)   RBC / HPF 3-6  <3 (RBC/hpf)   Bacteria, UA RARE  RARE     Pain control, IVFs.  Recheck at 0705 pain improved and tolerating PO contrast   CT scan pending at 0715 - PT care TX to Dr Fredderick Phenix, dispo per CT results.     MDM   LLQ ABD pain and tenderness. CT to evaluate.         Sunnie Nielsen, MD 07/27/11 [phone]

## 2011-07-29 LAB — URINE CULTURE
Colony Count: >=100000
Culture  Setup Time: 201301231618

## 2011-07-30 NOTE — ED Notes (Signed)
+   Urine. Treated with Cipro. Sensitive to same. Per protocol MD. °

## 2012-02-16 ENCOUNTER — Emergency Department (HOSPITAL_COMMUNITY): Payer: Medicare Other

## 2012-02-16 ENCOUNTER — Emergency Department (HOSPITAL_COMMUNITY)
Admission: EM | Admit: 2012-02-16 | Discharge: 2012-02-16 | Disposition: A | Discharge status: Home / Self Care | Payer: Medicare Other | Attending: Emergency Medicine | Admitting: Emergency Medicine

## 2012-02-16 ENCOUNTER — Encounter (HOSPITAL_COMMUNITY): Payer: Self-pay | Admitting: Emergency Medicine

## 2012-02-16 DIAGNOSIS — R110 Nausea: Secondary | ICD-10-CM | POA: Insufficient documentation

## 2012-02-16 DIAGNOSIS — R079 Chest pain, unspecified: Secondary | ICD-10-CM | POA: Insufficient documentation

## 2012-02-16 LAB — BASIC METABOLIC PANEL
BUN: 8 mg/dL (ref 6–23)
CO2: 26 mEq/L (ref 19–32)
Calcium: 9.5 mg/dL (ref 8.4–10.5)
Chloride: 107 mEq/L (ref 96–112)
Creatinine, Ser: 0.67 mg/dL (ref 0.50–1.10)
GFR calc Af Amer: >90 mL/min (ref >90)
GFR calc non Af Amer: >90 mL/min (ref >90)
Glucose, Bld: 92 mg/dL (ref 70–99)
Potassium: 3.8 mEq/L (ref 3.5–5.1)
Sodium: 142 mEq/L (ref 135–145)

## 2012-02-16 LAB — URINALYSIS, ROUTINE W REFLEX MICROSCOPIC
Bilirubin Urine: NEGATIVE
Glucose, UA: NEGATIVE mg/dL
Hgb urine dipstick: NEGATIVE
Ketones, ur: NEGATIVE mg/dL
Leukocytes, UA: NEGATIVE
Nitrite: NEGATIVE
Protein, ur: NEGATIVE mg/dL
Specific Gravity, Urine: 1.01 (ref 1.005–1.030)
Urobilinogen, UA: 0.2 mg/dL (ref 0.0–1.0)
pH: 6.5 (ref 5.0–8.0)

## 2012-02-16 LAB — CBC
HCT: 39.7 % (ref 36.0–46.0)
Hemoglobin: 13.3 g/dL (ref 12.0–15.0)
MCH: 30.4 pg (ref 26.0–34.0)
MCHC: 33.5 g/dL (ref 30.0–36.0)
MCV: 90.8 fL (ref 78.0–100.0)
Platelets: 200 10*3/uL (ref 150–400)
RBC: 4.37 MIL/uL (ref 3.87–5.11)
RDW: 14.4 % (ref 11.5–15.5)
WBC: 5.2 10*3/uL (ref 4.0–10.5)

## 2012-02-16 LAB — POCT I-STAT TROPONIN I: Troponin i, poc: 0 ng/mL (ref 0.00–0.08)

## 2012-02-16 MED ORDER — ONDANSETRON HCL 4 MG/2ML IJ SOLN
4.0000 mg | Freq: Once | INTRAMUSCULAR | Status: AC
  Administered 2012-02-16: 4 mg via INTRAVENOUS
  Filled 2012-02-16: qty 2

## 2012-02-16 MED ORDER — ONDANSETRON 8 MG PO TBDP: 8.0000 mg | ORAL_TABLET | Freq: Three times a day (TID) | ORAL | Status: AC | PRN

## 2012-02-16 NOTE — ED Notes (Signed)
Pt here with c/o n/v light headed and chest pain that is off and on

## 2012-02-16 NOTE — ED Provider Notes (Signed)
History     CSN: [id number]  Arrival date & time 02/16/12  0902   First MD Initiated Contact with Patient 02/16/12 [phone]      Chief Complaint  Patient presents with  . Nausea    (Consider location/radiation/quality/duration/timing/severity/associated sxs/prior treatment) HPI Comments: Patient presents with chief complaint of left sided chest pain with radiation to her left shoulder and neck described as sharp since last evening. Patient also states she has had nausea and generalized fatigue. She does not have any history of heart problems. Patient states that the pain is worse sometimes with breathing and palpation of her left neck and shoulder. When the pain is at its worst it lasts for several minutes before resolving spontaneously. The pain can come on at rest and with activity. Patient states that she has become slightly short of breath with the pain and has been slightly more short of breath with activity. She denies hemoptysis. She denies history of DVT or PE. Patient had a stress test "a long time ago". She does not regularly see a primary care physician. She knows that she has high blood pressure but is unsure of high cholesterol. No history of diabetes. Remote smoking history of approximately 10 pack years. Onset is acute. Course is intermittent. Nothing makes symptoms better.  Patient is a 66 y.o. female presenting with chest pain. The history is provided by the patient.  Chest Pain The chest pain began yesterday. Chest pain occurs intermittently. The chest pain is unchanged. The pain is associated with breathing. The severity of the pain is moderate. The quality of the pain is described as aching and sharp. The pain radiates to the left neck and left shoulder. Chest pain is worsened by certain positions and deep breathing. Primary symptoms include fatigue, shortness of breath, nausea and dizziness. Pertinent negatives for primary symptoms include no fever, no syncope, no cough, no  wheezing, no palpitations, no abdominal pain and no vomiting.  Dizziness also occurs with nausea. Dizziness does not occur with vomiting or diaphoresis.  Pertinent negatives for associated symptoms include no diaphoresis. She tried nothing for the symptoms.  Her past medical history is significant for hypertension.  Pertinent negatives for past medical history include no diabetes, no DVT and no hyperlipidemia.  Procedure history is negative for stress echo (remote).     History reviewed. No pertinent past medical history.  History reviewed. No pertinent past surgical history.  History reviewed. No pertinent family history.  History  Substance Use Topics  . Smoking status: Never Smoker   . Smokeless tobacco: Not on file  . Alcohol Use: No    OB History    Grav Para Term Preterm Abortions TAB SAB Ect Mult Living                  Review of Systems  Constitutional: Positive for fatigue. Negative for fever and diaphoresis.  HENT: Negative for neck pain.   Eyes: Negative for redness.  Respiratory: Positive for shortness of breath. Negative for cough and wheezing.   Cardiovascular: Positive for chest pain. Negative for palpitations, leg swelling and syncope.  Gastrointestinal: Positive for nausea. Negative for vomiting and abdominal pain.  Genitourinary: Negative for dysuria.  Musculoskeletal: Negative for back pain.  Skin: Negative for rash.  Neurological: Positive for dizziness and light-headedness. Negative for syncope.    Allergies  Beta adrenergic blockers and Codeine  Home Medications   Current Outpatient Rx  Name Route Sig Dispense Refill  . FOLIC ACID PO Oral Take  5 tablets by mouth daily.    Marland Kitchen METHOTREXATE SODIUM 2.5 MG PO TABS Oral Take 10 mg by mouth once a week. Take on Fridays.    Marland Kitchen ONDANSETRON 8 MG PO TBDP Oral Take 1 tablet (8 mg total) by mouth every 8 (eight) hours as needed for nausea. 6 tablet 0    BP 138/93  Pulse 81  Temp 97.5 F (36.4 C) (Oral)   Resp 18  Physical Exam  Nursing note and vitals reviewed. Constitutional: She is oriented to person, place, and time. She appears well-developed and well-nourished.  HENT:  Head: Normocephalic and atraumatic.  Mouth/Throat: Mucous membranes are normal. Mucous membranes are not dry.  Eyes: Conjunctivae are normal.  Neck: Trachea normal and normal range of motion. Neck supple. Normal carotid pulses and no JVD present. No muscular tenderness present. Carotid bruit is not present. No tracheal deviation present.  Cardiovascular: Normal rate, regular rhythm, S1 normal, S2 normal, normal heart sounds and intact distal pulses.  Exam reveals no decreased pulses.   No murmur heard. Pulmonary/Chest: Effort normal. No respiratory distress. She has no wheezes. She exhibits tenderness (left lateral chest, left clavicle, left shoulder).  Abdominal: Soft. Normal aorta and bowel sounds are normal. There is no tenderness. There is no rebound and no guarding.  Musculoskeletal: Normal range of motion. She exhibits no edema and no tenderness.       No LE edema, scattered psoriasis noted.   Neurological: She is alert and oriented to person, place, and time.  Skin: Skin is warm and dry. She is not diaphoretic. No cyanosis. No pallor.  Psychiatric: She has a normal mood and affect.    ED Course  Procedures (including critical care time)   Labs Reviewed  CBC  BASIC METABOLIC PANEL  POCT I-STAT TROPONIN I  URINALYSIS, ROUTINE W REFLEX MICROSCOPIC   Dg Chest 2 View  02/16/2012  *RADIOLOGY REPORT*  Clinical Data: 66 year old female with chest pain.  CHEST - 2 VIEW  Comparison: 05/04/2011 and prior chest radiographs  Findings: The cardiomediastinal silhouette is unremarkable. Minimal left basilar scarring is present. There is no evidence of focal airspace disease, pulmonary edema, suspicious pulmonary nodule/mass, pleural effusion, or pneumothorax. No acute bony abnormalities are identified. Cholecystectomy clips  and thoracic/lumbar degenerative disc disease/spondylosis again noted.  IMPRESSION: No evidence of acute cardiopulmonary disease.  Original Report Authenticated By: Rosendo Gros, M.D.     1. Chest pain   2. Nausea     10:09 AM Patient seen and examined. Work-up initiated. EKG reviewed.    Vital signs reviewed and are as follows: Filed Vitals:   02/16/12 0916  BP: 138/93  Pulse: 81  Temp: 97.5 F (36.4 C)  Resp: 18    Date: 02/16/2012  Rate: 77  Rhythm: normal sinus rhythm  QRS Axis: normal  Intervals: normal  ST/T Wave abnormalities: normal  Conduction Disutrbances:none  Narrative Interpretation:   Old EKG Reviewed: unchanged from 05/11/11  1:17 PM Patient discussed and results reviewed with Dr. Rubin Payor, who will see patient. Zofran for nausea. Will PO trial.   Patient seen by Dr. Rubin Payor. Will d/c to home with PCP follow-up. Patient informed of all results. Patient and husband are comfortable with this plan.   Patient was counseled to return with severe chest pain, especially if the pain is crushing or pressure-like and spreads to the arms, back, neck, or jaw, or if they have sweating, nausea, or shortness of breath with the pain. They were encouraged to call 911 with  these symptoms.   They were also told to return if their chest pain gets worse and does not go away with rest, they have an attack of chest pain lasting longer than usual despite rest and treatment with the medications their caregiver has prescribed, if they wake from sleep with chest pain or shortness of breath, if they feel dizzy or faint, if they have chest pain not typical of their usual pain, or if they have any other emergent concerns regarding their health.  The patient verbalized understanding and agreed.     MDM  Reproducible L chest/shoulder pain. No previous cardiac history. EKG normal. Work-up in ED is not concerning for ACS. Do not suspect PE clinically given presentation. Pain seems  musculoskeletal in nature given pain with palpation of chest wall, movement of left arm. Patient appears well. No resp distress, VSS.         Renne Crigler, [place] 02/16/12 1537

## 2012-02-17 NOTE — ED Provider Notes (Signed)
Medical screening examination/treatment/procedure(s) were performed by non-physician practitioner and as supervising physician I was immediately available for consultation/collaboration.  Namiko Pritts R. Philipe Laswell, MD 02/17/12 1634 

## 2012-07-14 ENCOUNTER — Encounter (HOSPITAL_COMMUNITY): Payer: Self-pay | Admitting: Nurse Practitioner

## 2012-07-14 ENCOUNTER — Emergency Department (HOSPITAL_COMMUNITY)
Admission: EM | Admit: 2012-07-14 | Discharge: 2012-07-14 | Disposition: A | Discharge status: Home / Self Care | Payer: Medicare Other | Attending: Emergency Medicine | Admitting: Emergency Medicine

## 2012-07-14 ENCOUNTER — Emergency Department (HOSPITAL_COMMUNITY): Payer: Medicare Other

## 2012-07-14 DIAGNOSIS — Z87828 Personal history of other (healed) physical injury and trauma: Secondary | ICD-10-CM | POA: Insufficient documentation

## 2012-07-14 DIAGNOSIS — Y939 Activity, unspecified: Secondary | ICD-10-CM | POA: Insufficient documentation

## 2012-07-14 DIAGNOSIS — R6883 Chills (without fever): Secondary | ICD-10-CM | POA: Insufficient documentation

## 2012-07-14 DIAGNOSIS — M5430 Sciatica, unspecified side: Secondary | ICD-10-CM

## 2012-07-14 DIAGNOSIS — L408 Other psoriasis: Secondary | ICD-10-CM | POA: Insufficient documentation

## 2012-07-14 DIAGNOSIS — W010XXA Fall on same level from slipping, tripping and stumbling without subsequent striking against object, initial encounter: Secondary | ICD-10-CM | POA: Insufficient documentation

## 2012-07-14 DIAGNOSIS — Y92009 Unspecified place in unspecified non-institutional (private) residence as the place of occurrence of the external cause: Secondary | ICD-10-CM | POA: Insufficient documentation

## 2012-07-14 DIAGNOSIS — I10 Essential (primary) hypertension: Secondary | ICD-10-CM | POA: Insufficient documentation

## 2012-07-14 DIAGNOSIS — S8990XA Unspecified injury of unspecified lower leg, initial encounter: Secondary | ICD-10-CM | POA: Insufficient documentation

## 2012-07-14 DIAGNOSIS — R269 Unspecified abnormalities of gait and mobility: Secondary | ICD-10-CM | POA: Insufficient documentation

## 2012-07-14 DIAGNOSIS — M549 Dorsalgia, unspecified: Secondary | ICD-10-CM

## 2012-07-14 DIAGNOSIS — S99929A Unspecified injury of unspecified foot, initial encounter: Secondary | ICD-10-CM | POA: Insufficient documentation

## 2012-07-14 HISTORY — DX: Psoriasis, unspecified: L40.9

## 2012-07-14 HISTORY — DX: Other intervertebral disc degeneration, lumbar region: M51.36

## 2012-07-14 HISTORY — DX: Essential (primary) hypertension: I10

## 2012-07-14 HISTORY — DX: Other intervertebral disc degeneration, lumbar region without mention of lumbar back pain or lower extremity pain: M51.369

## 2012-07-14 MED ORDER — TRAMADOL HCL 50 MG PO TABS: 50.0000 mg | ORAL_TABLET | Freq: Four times a day (QID) | ORAL | Status: DC | PRN

## 2012-07-14 MED ORDER — PREDNISONE 10 MG PO TABS: 20.0000 mg | ORAL_TABLET | Freq: Every day | ORAL | Status: DC

## 2012-07-14 MED ORDER — ONDANSETRON HCL 4 MG PO TABS: 4.0000 mg | ORAL_TABLET | Freq: Four times a day (QID) | ORAL | Status: DC

## 2012-07-14 MED ORDER — PREDNISONE 20 MG PO TABS
60.0000 mg | ORAL_TABLET | Freq: Once | ORAL | Status: AC
  Administered 2012-07-14: 60 mg via ORAL
  Filled 2012-07-14: qty 3

## 2012-07-14 NOTE — ED Provider Notes (Signed)
History     CSN: [id number]  Arrival date & time 07/14/12  1207   None     Chief Complaint  Patient presents with  . Fall   HPI Eileen Nguyen is a 67 y.o. female who presents to the ED with low back pain and bilateral knee pain. The pain started several days ago after a fall. She states she slipped and fell on the ground at home. She has been taking tylenol without relief. Today the pain got so bad she decided she had to come in for evaluation. The history was provided by the patient.   Past Medical History  Diagnosis Date  . Psoriasis   . Hypertension   . Degenerative disc disease, lumbar     History reviewed. No pertinent past surgical history.  History reviewed. No pertinent family history.  History  Substance Use Topics  . Smoking status: Never Smoker   . Smokeless tobacco: Not on file  . Alcohol Use: No    OB History    Grav Para Term Preterm Abortions TAB SAB Ect Mult Living                  Review of Systems  Constitutional: Positive for chills. Negative for fever, diaphoresis and fatigue.  HENT: Positive for neck pain. Negative for ear pain, congestion, sore throat, facial swelling, neck stiffness, dental problem and sinus pressure.   Eyes: Negative for photophobia, pain and discharge.  Respiratory: Negative for cough, chest tightness, shortness of breath and wheezing.   Cardiovascular: Positive for leg swelling. Negative for chest pain.  Gastrointestinal: Negative for nausea, vomiting, abdominal pain, diarrhea, constipation and abdominal distention.  Genitourinary: Negative for dysuria, frequency, flank pain and difficulty urinating.  Musculoskeletal: Positive for back pain and gait problem (pain with ambulation, his of DJD ). Negative for myalgias.       Bilateral knee pain due to fall  Skin: Positive for rash (psoriasis ). Negative for color change.  Neurological: Negative for dizziness, speech difficulty, weakness, light-headedness, numbness and  headaches.  Psychiatric/Behavioral: Negative for confusion and agitation. The patient is not nervous/anxious.     Allergies  Beta adrenergic blockers and Codeine  Home Medications  No current outpatient prescriptions on file.  BP 144/74  Pulse 84  Temp 97.9 F (36.6 C) (Oral)  Resp 16  SpO2 98%  Physical Exam  Nursing note and vitals reviewed. Constitutional: She is oriented to person, place, and time. She appears well-developed and well-nourished. No distress.  HENT:  Head: Normocephalic and atraumatic.  Right Ear: External ear normal.  Left Ear: External ear normal.  Eyes: EOM are normal. Pupils are equal, round, and reactive to light.  Neck: Neck supple. No tracheal deviation present.  Cardiovascular: Normal rate and regular rhythm.   Pulmonary/Chest: Effort normal. No respiratory distress. She has no wheezes.  Abdominal: Soft. There is no tenderness.  Musculoskeletal:       Difficulty with ambulation due to pain. Grips equal bilateral. Pain with palpation of lumbar spine. Pain radiates to the leg.  Neurological: She is alert and oriented to person, place, and time. No cranial nerve deficit.  Skin: Skin is warm and dry. Rash noted.       psoriasis  Psychiatric: She has a normal mood and affect. Her behavior is normal. Judgment and thought content normal.   Procedures Care turned over to Levy Sjogren, NP @ 1600 CT results pending         Janne Napoleon, NP 07/14/12  1602 

## 2012-07-14 NOTE — ED Provider Notes (Signed)
1600 Report received from [facility] NP. Awaiting CT lumbar for lower back and RLE pain then the patient will be dispositioned.    1645 CT shows DDD and buldging disc at L2-3, L3-4.  Patient will follow up with her orthopedic physician next week.  Rx for prednisone and tramadol/zofran  pain meds. Patient agrees with plan and is ready for discharge.   Remi Haggard, NP 07/15/12 1055

## 2012-07-14 NOTE — ED Notes (Signed)
Pt slipped outside in rain and fell onto buttocks. C/o bilateral knee and buttocks pain since the fall. Ambulatory, mae, A&Ox4. Denies any head injury.

## 2012-07-14 NOTE — ED Provider Notes (Signed)
Medical screening examination/treatment/procedure(s) were performed by non-physician practitioner and as supervising physician I was immediately available for consultation/collaboration. Devoria Albe, MD, Armando Gang   Ward Givens, MD 07/14/12 [phone]

## 2012-07-15 NOTE — ED Provider Notes (Signed)
Medical screening examination/treatment/procedure(s) were performed by non-physician practitioner and as supervising physician I was immediately available for consultation/collaboration. Devoria Albe, MD, Armando Gang   Ward Givens, MD 07/15/12 [phone]

## 2012-09-04 IMAGING — CR DG CHEST 2V
2 series · 2 of 2 positions shown · non-contrast
Comparison: Chest x-ray 10/14/2006.

CLINICAL DATA: Chest pain.

CHEST - 2 VIEW

[w chest pa]
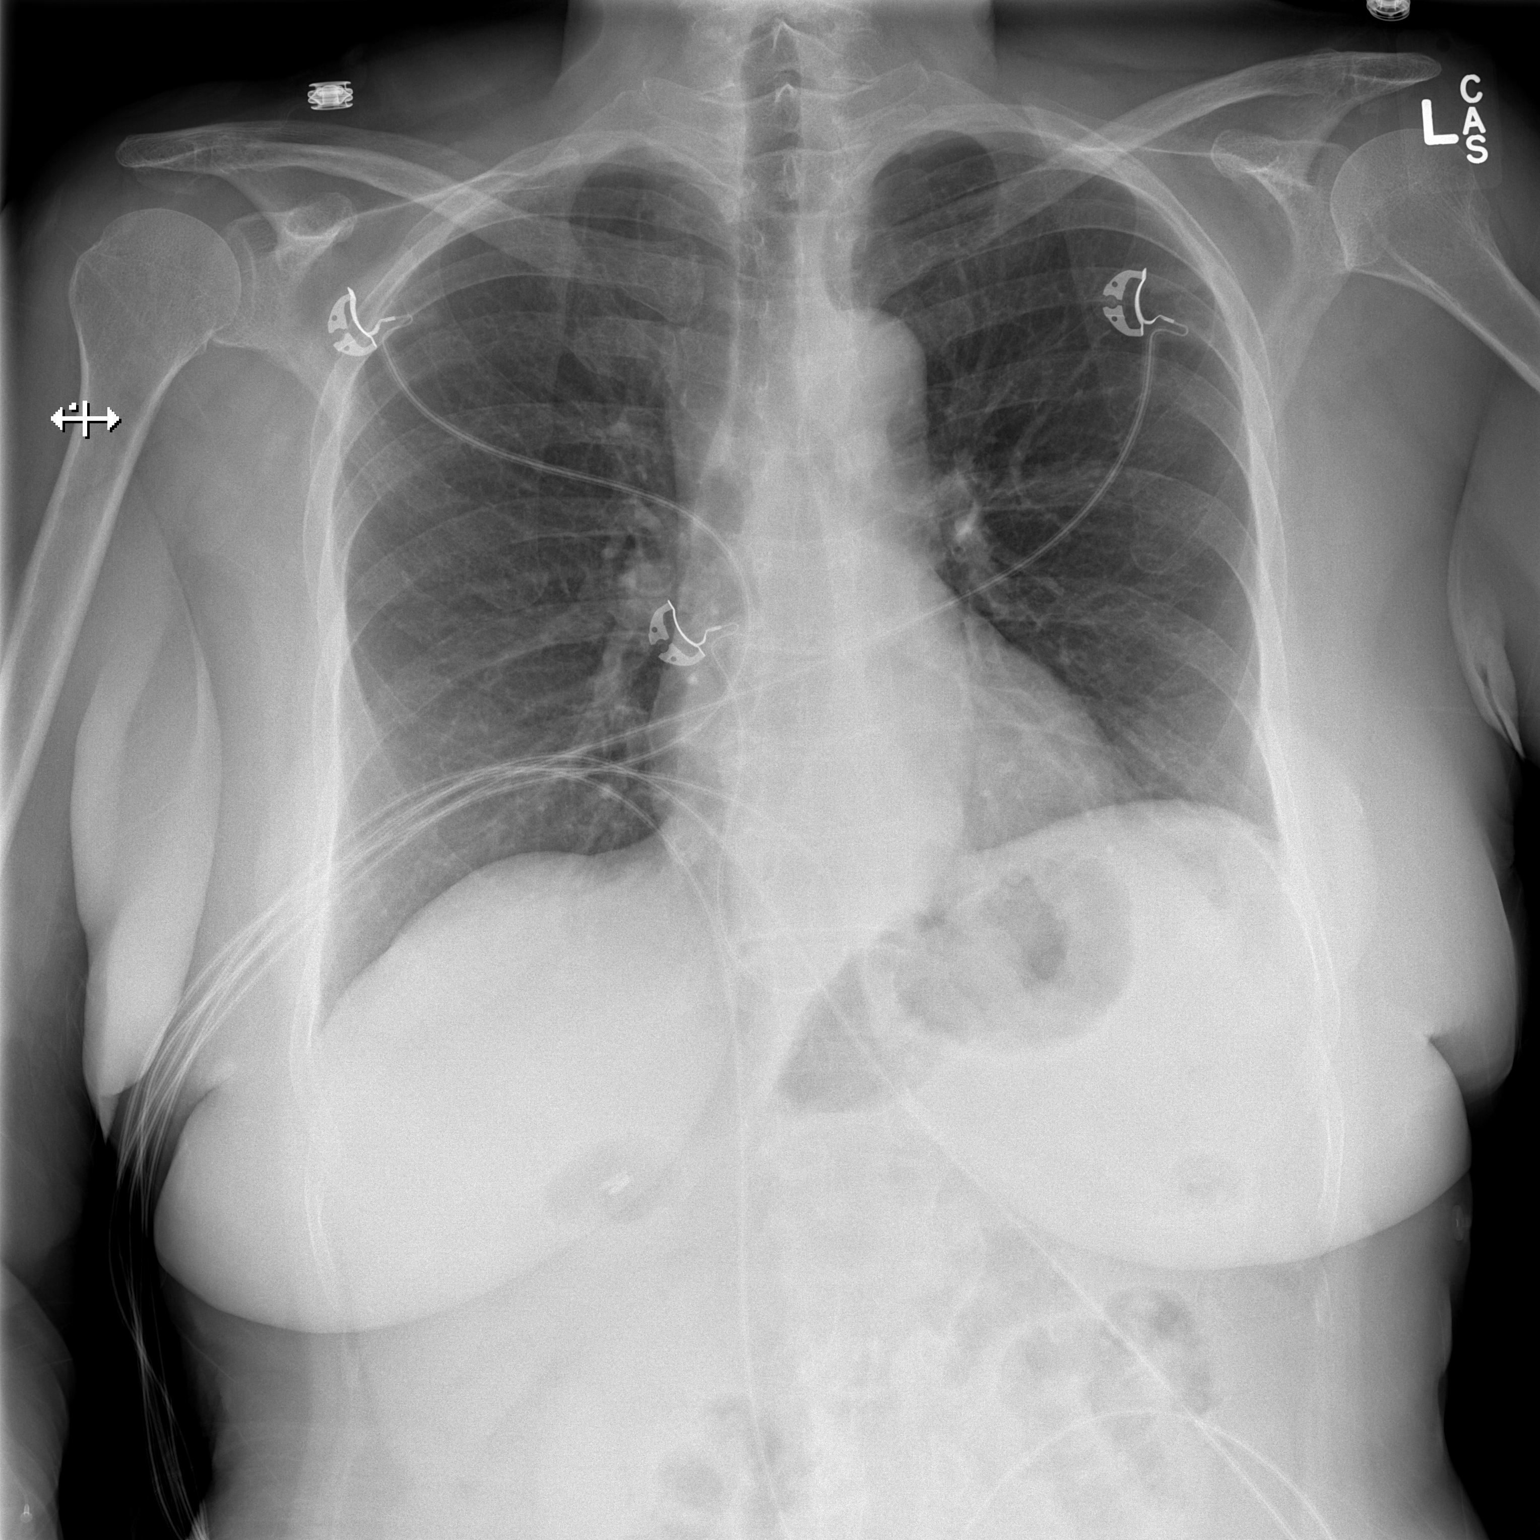

[w chest lat]
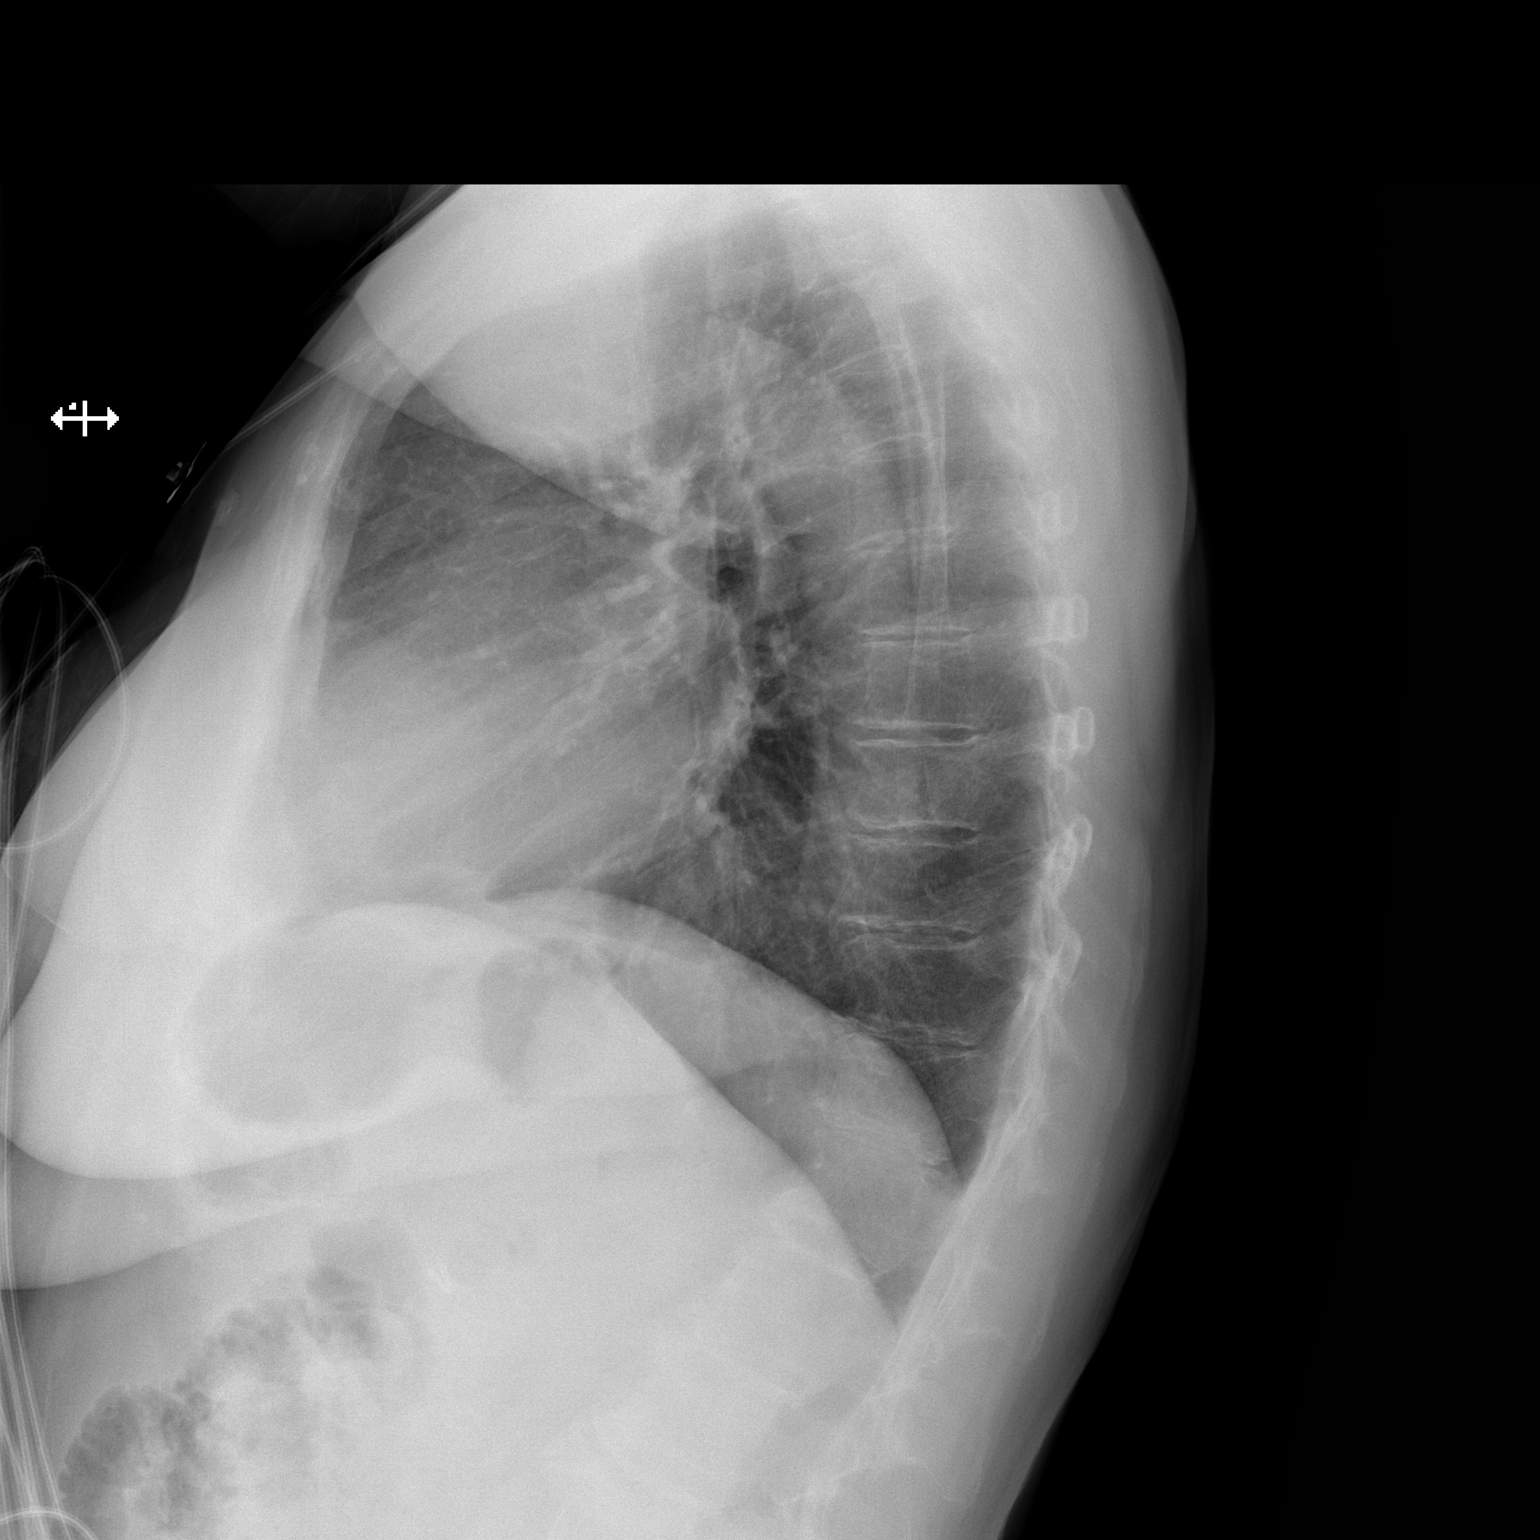

[2 of 2 positions shown; findings below may reference images not displayed]

FINDINGS: The cardiac silhouette, mediastinal and hilar contours
are within normal limits and stable.  The lungs are clear.  Bony
thorax is intact.
IMPRESSION: No acute cardiopulmonary findings.

## 2012-09-04 IMAGING — CT CT HEAD W/O CM
2 series · 16 of 30 positions shown, 20 images · non-contrast
Comparison: None.

CLINICAL DATA: Headache

CT HEAD WITHOUT CONTRAST
TECHNIQUE: Contiguous axial images were obtained from the base of
the skull through the vertex without contrast.

[Series 2: head w/o · axial · non-contrast · 0.43mm/px · z∈[+1166,+1286]mm · 13 of 28 slices shown, 17 images]
[im 2/28  brain]
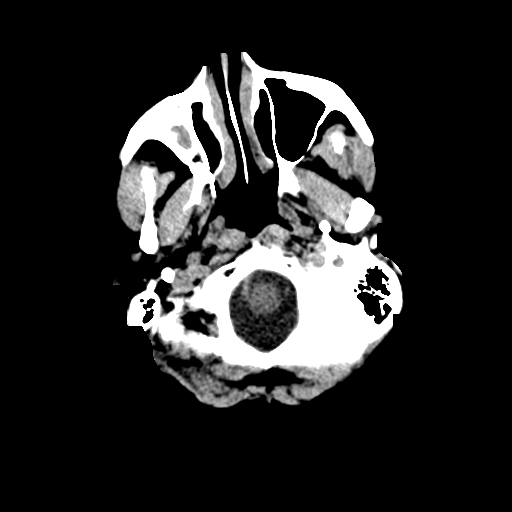
[im 2/28  bone]
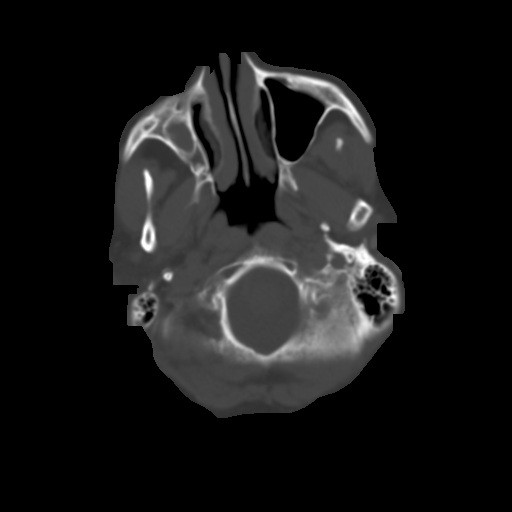
[im 4/28  brain]
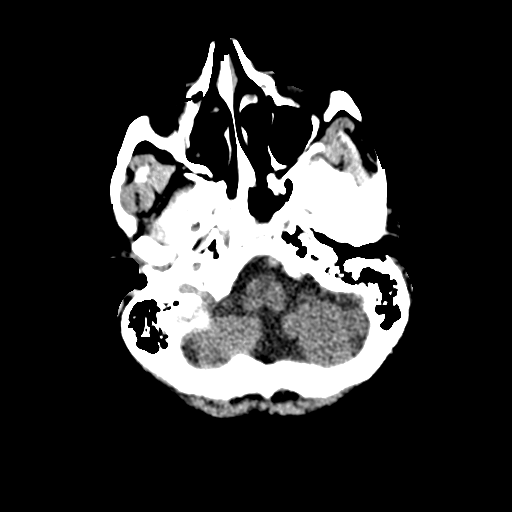
[im 6/28  brain]
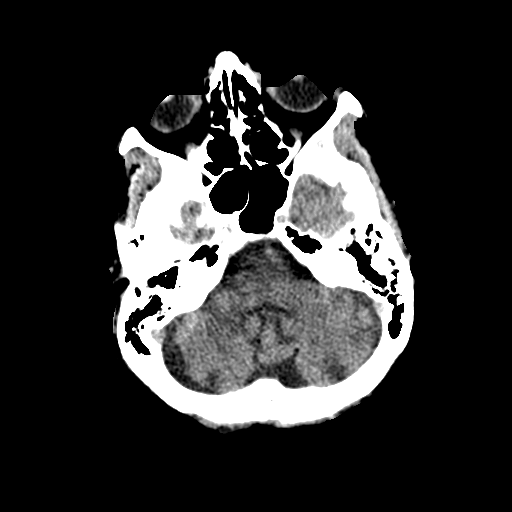
[im 8/28  brain]
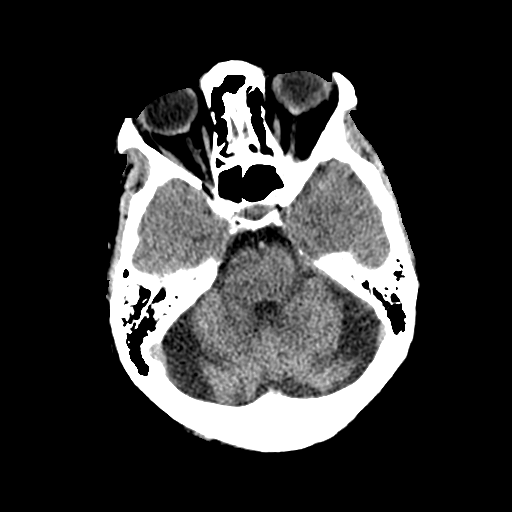
[im 10/28  brain]
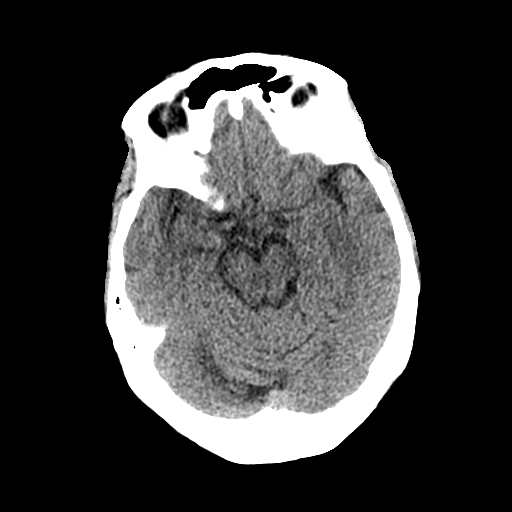
[im 10/28  bone]
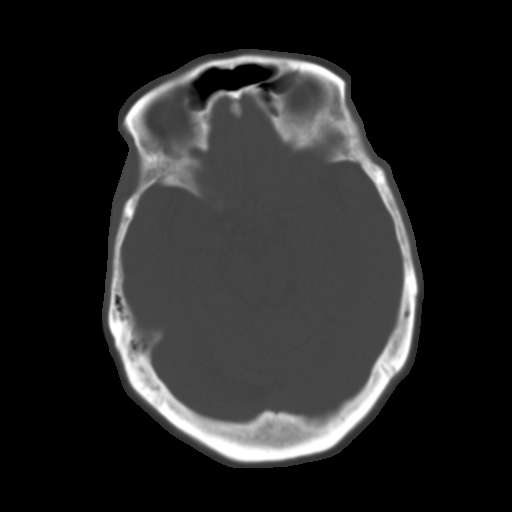
[im 12/28  brain]
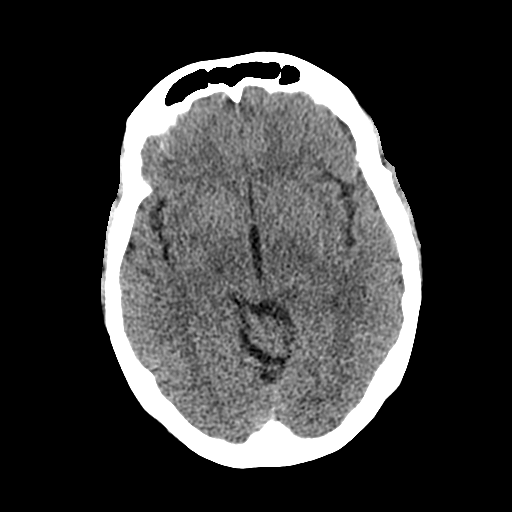
[im 14/28  brain]
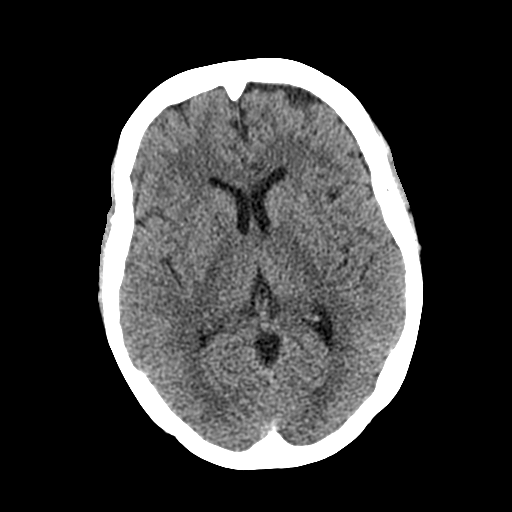
[im 16/28  brain]
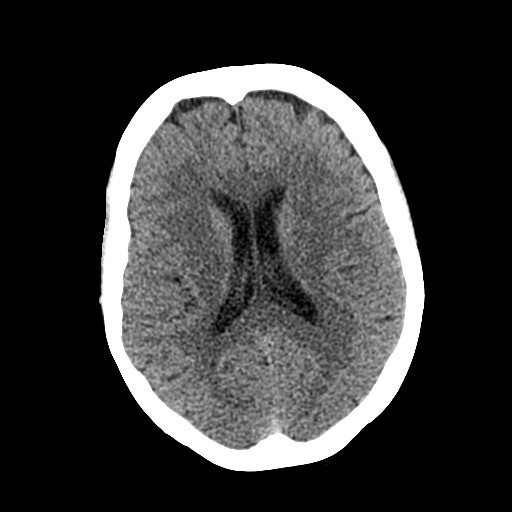
[im 18/28  brain]
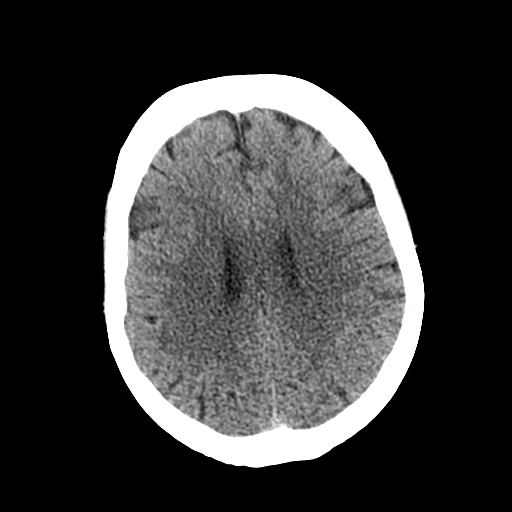
[im 18/28  bone]
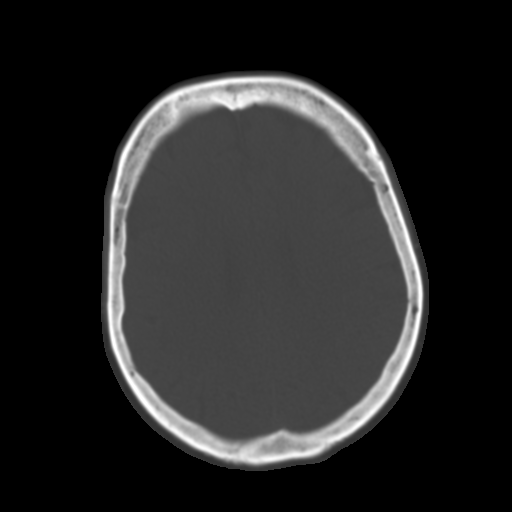
[im 20/28  brain]
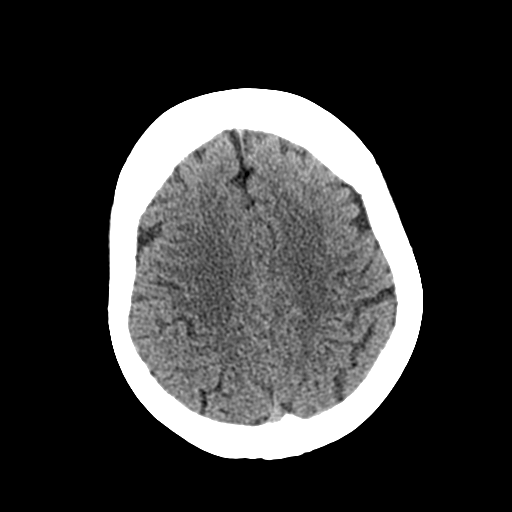
[im 22/28  brain]
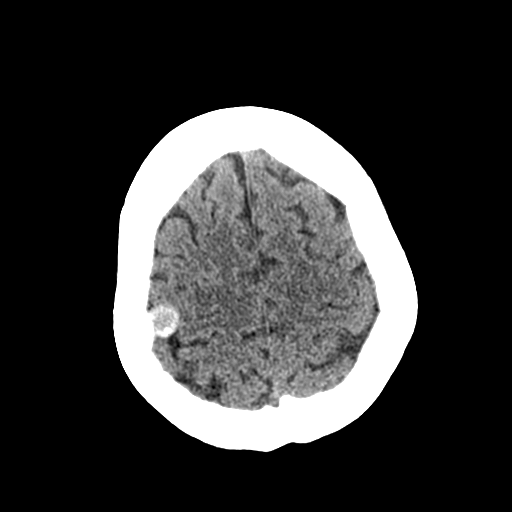
[im 24/28  brain]
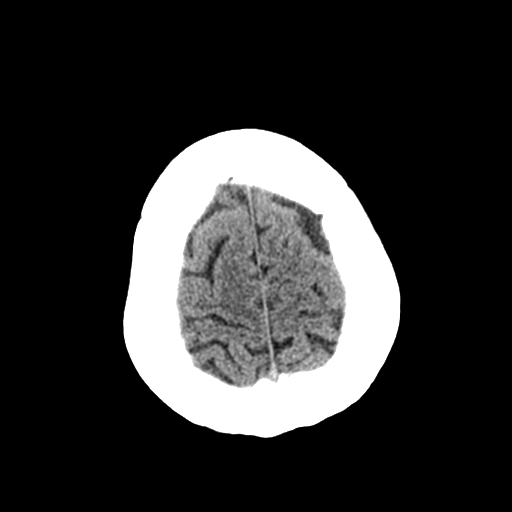
[im 26/28  brain]
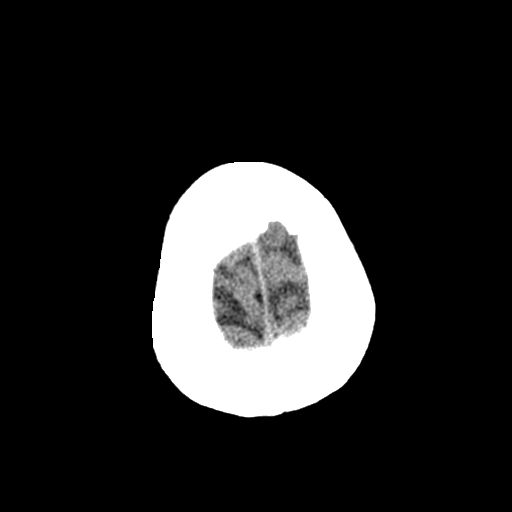
[im 26/28  bone]
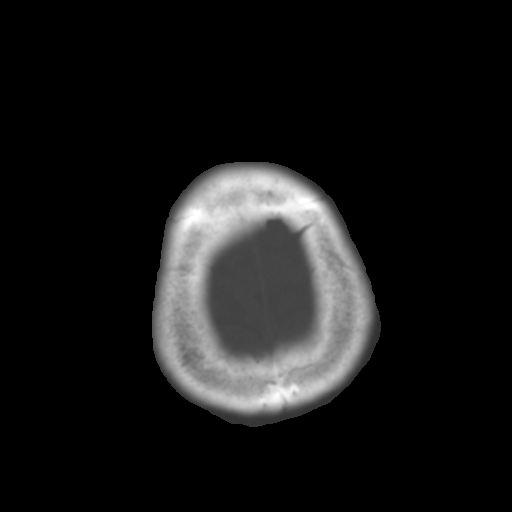

[Series 3: bone windows · axial · 0.43mm/px · z∈[+1166,+1206]mm · 3 of 28 slices shown]
[im 2/28  bone]
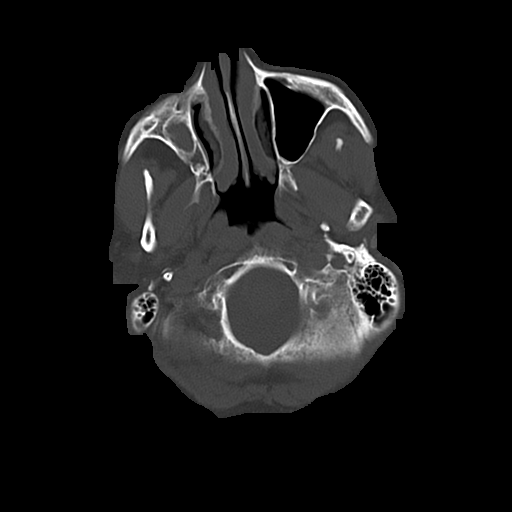
[im 6/28  bone]
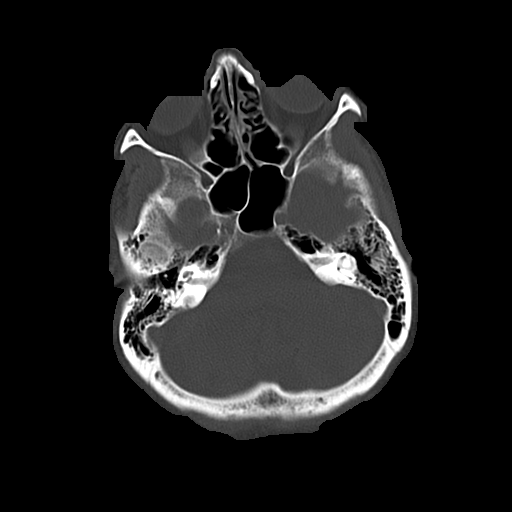
[im 10/28  bone]
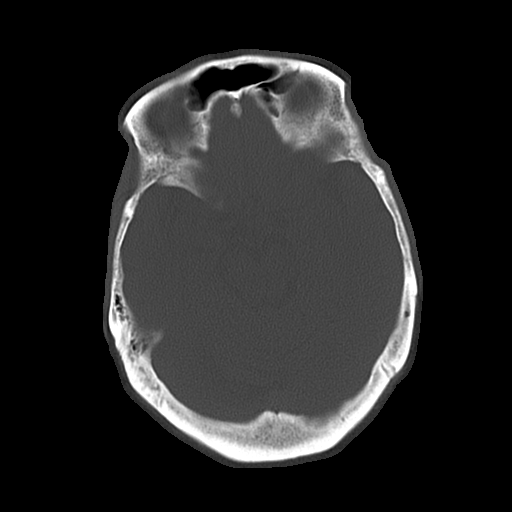

[16 of 30 positions shown; findings below may reference images not displayed]

FINDINGS: Cerebellar atrophy bilaterally.  No significant cerebral
atrophy.  Ventricle size is normal.  No acute infarct or
hemorrhage.

13 mm peripheral mass right parietal region.  This may be calcified
and is probably a meningioma.  No brain edema.  Further evaluation
with MRI with contrast is suggested.

Calvarium is intact.  Chronic sinusitis right maxillary sinus which
is contracted and non aerated.
IMPRESSION: 13 mm calcified peripheral lesion right parietal lobe may represent
a meningioma.  Further evaluation with elective  MRI with contrast
is suggested.

Chronic sinusitis right maxillary sinus.

Significant cerebellar atrophy

## 2013-02-18 ENCOUNTER — Emergency Department (HOSPITAL_COMMUNITY)
Admission: EM | Admit: 2013-02-18 | Discharge: 2013-02-18 | Disposition: A | Discharge status: Home / Self Care | Payer: Medicare Other | Attending: Emergency Medicine | Admitting: Emergency Medicine

## 2013-02-18 ENCOUNTER — Encounter (HOSPITAL_COMMUNITY): Payer: Self-pay | Admitting: Emergency Medicine

## 2013-02-18 ENCOUNTER — Emergency Department (HOSPITAL_COMMUNITY): Payer: Medicare Other

## 2013-02-18 DIAGNOSIS — M5137 Other intervertebral disc degeneration, lumbosacral region: Secondary | ICD-10-CM | POA: Insufficient documentation

## 2013-02-18 DIAGNOSIS — H8110 Benign paroxysmal vertigo, unspecified ear: Secondary | ICD-10-CM | POA: Insufficient documentation

## 2013-02-18 DIAGNOSIS — M51379 Other intervertebral disc degeneration, lumbosacral region without mention of lumbar back pain or lower extremity pain: Secondary | ICD-10-CM | POA: Insufficient documentation

## 2013-02-18 DIAGNOSIS — I10 Essential (primary) hypertension: Secondary | ICD-10-CM | POA: Insufficient documentation

## 2013-02-18 DIAGNOSIS — Z8719 Personal history of other diseases of the digestive system: Secondary | ICD-10-CM | POA: Insufficient documentation

## 2013-02-18 DIAGNOSIS — Z872 Personal history of diseases of the skin and subcutaneous tissue: Secondary | ICD-10-CM | POA: Insufficient documentation

## 2013-02-18 HISTORY — DX: Gastro-esophageal reflux disease without esophagitis: K21.9

## 2013-02-18 LAB — CBC WITH DIFFERENTIAL/PLATELET
Basophils Absolute: 0 10*3/uL (ref 0.0–0.1)
Basophils Relative: 0 % (ref 0–1)
Eosinophils Absolute: 0.2 10*3/uL (ref 0.0–0.7)
Eosinophils Relative: 4 % (ref 0–5)
HCT: 40.5 % (ref 36.0–46.0)
Hemoglobin: 13.7 g/dL (ref 12.0–15.0)
Lymphocytes Relative: 24 % (ref 12–46)
Lymphs Abs: 1.4 10*3/uL (ref 0.7–4.0)
MCH: 29.3 pg (ref 26.0–34.0)
MCHC: 33.8 g/dL (ref 30.0–36.0)
MCV: 86.7 fL (ref 78.0–100.0)
Monocytes Absolute: 0.5 10*3/uL (ref 0.1–1.0)
Monocytes Relative: 8 % (ref 3–12)
Neutro Abs: 3.6 10*3/uL (ref 1.7–7.7)
Neutrophils Relative %: 64 % (ref 43–77)
Platelets: 200 10*3/uL (ref 150–400)
RBC: 4.67 MIL/uL (ref 3.87–5.11)
RDW: 13.4 % (ref 11.5–15.5)
WBC: 5.7 10*3/uL (ref 4.0–10.5)

## 2013-02-18 LAB — POCT I-STAT, CHEM 8
BUN: 8 mg/dL (ref 6–23)
Calcium, Ion: 1.27 mmol/L (ref 1.13–1.30)
Chloride: 106 mEq/L (ref 96–112)
Creatinine, Ser: 0.9 mg/dL (ref 0.50–1.10)
Glucose, Bld: 86 mg/dL (ref 70–99)
HCT: 41 % (ref 36.0–46.0)
Hemoglobin: 13.9 g/dL (ref 12.0–15.0)
Potassium: 4.6 mEq/L (ref 3.5–5.1)
Sodium: 143 mEq/L (ref 135–145)
TCO2: 27 mmol/L (ref 0–100)

## 2013-02-18 LAB — URINALYSIS, ROUTINE W REFLEX MICROSCOPIC
Bilirubin Urine: NEGATIVE
Glucose, UA: NEGATIVE mg/dL
Hgb urine dipstick: NEGATIVE
Ketones, ur: NEGATIVE mg/dL
Nitrite: NEGATIVE
Protein, ur: NEGATIVE mg/dL
Specific Gravity, Urine: 1.016 (ref 1.005–1.030)
Urobilinogen, UA: 0.2 mg/dL (ref 0.0–1.0)
pH: 6 (ref 5.0–8.0)

## 2013-02-18 LAB — URINE MICROSCOPIC-ADD ON

## 2013-02-18 LAB — POCT I-STAT TROPONIN I: Troponin i, poc: 0 ng/mL (ref 0.00–0.08)

## 2013-02-18 MED ORDER — DIAZEPAM 5 MG PO TABS
5.0000 mg | ORAL_TABLET | Freq: Once | ORAL | Status: AC
  Administered 2013-02-18: 5 mg via ORAL
  Filled 2013-02-18: qty 1

## 2013-02-18 MED ORDER — GADOBENATE DIMEGLUMINE 529 MG/ML IV SOLN
15.0000 mL | Freq: Once | INTRAVENOUS | Status: AC | PRN
  Administered 2013-02-18: 15 mL via INTRAVENOUS

## 2013-02-18 MED ORDER — DIAZEPAM 5 MG PO TABS: 5.0000 mg | ORAL_TABLET | Freq: Two times a day (BID) | ORAL | Status: DC | PRN

## 2013-02-18 MED ORDER — ONDANSETRON 4 MG PO TBDP
8.0000 mg | ORAL_TABLET | Freq: Once | ORAL | Status: AC
  Administered 2013-02-18: 8 mg via ORAL
  Filled 2013-02-18: qty 2
  Filled 2013-02-18: qty 1

## 2013-02-18 MED ORDER — ONDANSETRON HCL 4 MG PO TABS: 4.0000 mg | ORAL_TABLET | Freq: Four times a day (QID) | ORAL | Status: DC

## 2013-02-18 NOTE — ED Provider Notes (Addendum)
CSN: [id number]     Arrival date & time 02/18/13  1345 History     First MD Initiated Contact with Patient 02/18/13 1355     Chief Complaint  Patient presents with  . Dizziness   (Consider location/radiation/quality/duration/timing/severity/associated sxs/prior Treatment) Patient is a 67 y.o. female presenting with neurologic complaint. The history is provided by the patient.  Neurologic Problem This is a new problem. Episode onset: 2 weeks ago but worse in the last 2 days. The problem occurs constantly. The problem has been gradually worsening. Associated symptoms include headaches. Pertinent negatives include no chest pain, no abdominal pain and no shortness of breath. Associated symptoms comments: Feelings of dizziness like on a boat or merry go round that is worse with walking or moving the head to the right.  Nausea without vomiting and no fever or URI sx.  Unsteady when walking but no falls.. The symptoms are aggravated by walking and standing (moving head). The symptoms are relieved by rest and lying down. She has tried nothing for the symptoms. The treatment provided no relief.    Past Medical History  Diagnosis Date  . Psoriasis   . Hypertension   . Degenerative disc disease, lumbar   . Acid reflux    No past surgical history on file. No family history on file. History  Substance Use Topics  . Smoking status: Never Smoker   . Smokeless tobacco: Not on file  . Alcohol Use: No   OB History   Grav Para Term Preterm Abortions TAB SAB Ect Mult Living                 Review of Systems  Constitutional: Negative for fever.  HENT: Positive for ear pain.        Pain just distal to bilateral ears and occasionally with have a popping sensation in the ears  Respiratory: Negative for shortness of breath.   Cardiovascular: Negative for chest pain.  Gastrointestinal: Positive for nausea. Negative for vomiting and abdominal pain.  Genitourinary: Negative for dysuria.   Neurological: Positive for dizziness and headaches. Negative for speech difficulty and numbness.  Psychiatric/Behavioral: Negative for confusion.  All other systems reviewed and are negative.    Allergies  Beta adrenergic blockers and Codeine  Home Medications   Current Outpatient Rx  Name  Route  Sig  Dispense  Refill  . ondansetron (ZOFRAN) 4 MG tablet   Oral   Take 1 tablet (4 mg total) by mouth every 6 (six) hours.   12 tablet   0   . predniSONE (DELTASONE) 10 MG tablet   Oral   Take 2 tablets (20 mg total) by mouth daily.   15 tablet   0   . traMADol (ULTRAM) 50 MG tablet   Oral   Take 1 tablet (50 mg total) by mouth every 6 (six) hours as needed for pain.   15 tablet   0    BP 134/69  Pulse 78  Temp(Src) 98.2 F (36.8 C) (Oral)  Resp 20  SpO2 97% Physical Exam  Nursing note and vitals reviewed. Constitutional: She is oriented to person, place, and time. She appears well-developed and well-nourished. No distress.  HENT:  Head: Normocephalic and atraumatic.  Right Ear: Tympanic membrane and ear canal normal.  Left Ear: Tympanic membrane and ear canal normal.  Mouth/Throat: Oropharynx is clear and moist.  Eyes: Conjunctivae and EOM are normal. Pupils are equal, round, and reactive to light.  Neck: Normal range of motion. Neck supple.  Carotid bruit is not present.  Cardiovascular: Normal rate, regular rhythm and intact distal pulses.   No murmur heard. Pulmonary/Chest: Effort normal and breath sounds normal. No respiratory distress. She has no wheezes. She has no rales.  Abdominal: Soft. She exhibits no distension. There is no tenderness. There is no rebound and no guarding.  Musculoskeletal: Normal range of motion. She exhibits no edema and no tenderness.  Neurological: She is alert and oriented to person, place, and time. She has normal strength. No cranial nerve deficit or sensory deficit.  Unsteady gait but no ataxia.  Normal heel to shin and no visual  field cuts.  Skin: Skin is warm and dry. Rash noted. No erythema.  Diffuse psoriasis affecting bilateral upper/lower ext and some on abdomen and face  Psychiatric: She has a normal mood and affect. Her behavior is normal.    ED Course   Procedures (including critical care time)  Labs Reviewed  URINALYSIS, ROUTINE W REFLEX MICROSCOPIC - Abnormal; Notable for the following:    Leukocytes, UA TRACE (*)    All other components within normal limits  URINE MICROSCOPIC-ADD ON - Abnormal; Notable for the following:    Squamous Epithelial / LPF FEW (*)    All other components within normal limits  CBC WITH DIFFERENTIAL  POCT I-STAT, CHEM 8  POCT I-STAT TROPONIN I   Mr Laqueta Jean Wo Contrast  02/18/2013   *RADIOLOGY REPORT*  Clinical Data: Nausea and dizziness.  Rule out CVA.  MRI HEAD WITHOUT AND WITH CONTRAST  Technique:  Multiplanar, multiecho pulse sequences of the brain and surrounding structures were obtained according to standard protocol without and with intravenous contrast  Contrast: 15mL MULTIHANCE GADOBENATE DIMEGLUMINE 529 MG/ML IV SOLN  Comparison:  CT 05/04/2011  Findings:  Negative for acute infarct.  Scattered small white matter hyperintensities consistent with chronic ischemia.  Ventricle size is normal.  There is significant cerebellar atrophy bilaterally.  Cerebral hemispheres do not show significant atrophy. Cerebellar atrophy unchanged from the prior CT.  14 x 15 mm enhancing extra-axial mass in the right parietal lobe compatible with a meningioma.  This is unchanged from the  prior CT.  There is  calcification in the mass on the prior CT.  No other enhancing lesions are identified.  Chronic sinusitis with opacification and contraction of the right maxillary sinus.  IMPRESSION: Negative for acute infarct  Moderate cerebellar atrophy bilaterally.  14 x 15 mm right parietal meningioma without brain edema.   Original Report Authenticated By: Janeece Riggers, M.D.     Date: 02/18/2013   Rate: 88  Rhythm: normal sinus rhythm  QRS Axis: normal  Intervals: normal  ST/T Wave abnormalities: normal  Conduction Disutrbances: none  Narrative Interpretation: unremarkable   1. Benign positional vertigo     MDM   Patient is here with complaints of vertigo, nausea and not feeling well for approximately the last 2 weeks but worse over the last 2 days. She is having difficulty walking with no frank ataxia on exam but is unsteady on her feet. She has no focal neurologic deficits and no carotid bruits. Patient has a history of hypertension but no other risk factors other than a for stroke. She does have untreated severe psoriasis but currently takes no medications.  Will do stroke workup including MRI to ensure that the vertigo is not from cerebellar pathology. Patient has had one head CT in the past 5 years that showed marked cerebellar atrophy and possible meningioma recommending MRI the patient never  had an MRI done.  Patient given Valium and Zofran for symptom improvement.  5:13 PM Pt with neg MRI for stroke and sx resolved after valium and zofran.  Feel most likely inner ear. She now is walking around the room with no unsteadiness or neuro findings.  Gwyneth Sprout, MD 02/18/13 1714  Gwyneth Sprout, MD 02/18/13 1718

## 2013-02-18 NOTE — ED Notes (Signed)
Patient is resting comfortably. 

## 2013-02-18 NOTE — ED Notes (Signed)
Family at bedside. 

## 2013-02-18 NOTE — ED Notes (Signed)
Sick x 1 week nauseated weak and dizzy has had neck  Pain up to ears

## 2013-09-13 ENCOUNTER — Emergency Department (HOSPITAL_COMMUNITY)
Admission: EM | Admit: 2013-09-13 | Discharge: 2013-09-13 | Disposition: A | Discharge status: Home / Self Care | Payer: Medicare Other | Attending: Emergency Medicine | Admitting: Emergency Medicine

## 2013-09-13 ENCOUNTER — Emergency Department (HOSPITAL_COMMUNITY): Payer: Medicare Other

## 2013-09-13 ENCOUNTER — Encounter (HOSPITAL_COMMUNITY): Payer: Self-pay | Admitting: Emergency Medicine

## 2013-09-13 DIAGNOSIS — Y929 Unspecified place or not applicable: Secondary | ICD-10-CM | POA: Insufficient documentation

## 2013-09-13 DIAGNOSIS — Z8719 Personal history of other diseases of the digestive system: Secondary | ICD-10-CM | POA: Insufficient documentation

## 2013-09-13 DIAGNOSIS — M79606 Pain in leg, unspecified: Secondary | ICD-10-CM

## 2013-09-13 DIAGNOSIS — R229 Localized swelling, mass and lump, unspecified: Secondary | ICD-10-CM | POA: Insufficient documentation

## 2013-09-13 DIAGNOSIS — X58XXXA Exposure to other specified factors, initial encounter: Secondary | ICD-10-CM | POA: Insufficient documentation

## 2013-09-13 DIAGNOSIS — Z872 Personal history of diseases of the skin and subcutaneous tissue: Secondary | ICD-10-CM | POA: Insufficient documentation

## 2013-09-13 DIAGNOSIS — Y9389 Activity, other specified: Secondary | ICD-10-CM | POA: Insufficient documentation

## 2013-09-13 DIAGNOSIS — S99929A Unspecified injury of unspecified foot, initial encounter: Principal | ICD-10-CM

## 2013-09-13 DIAGNOSIS — S8990XA Unspecified injury of unspecified lower leg, initial encounter: Secondary | ICD-10-CM | POA: Insufficient documentation

## 2013-09-13 DIAGNOSIS — S99919A Unspecified injury of unspecified ankle, initial encounter: Principal | ICD-10-CM

## 2013-09-13 DIAGNOSIS — Z8739 Personal history of other diseases of the musculoskeletal system and connective tissue: Secondary | ICD-10-CM | POA: Insufficient documentation

## 2013-09-13 DIAGNOSIS — I10 Essential (primary) hypertension: Secondary | ICD-10-CM | POA: Insufficient documentation

## 2013-09-13 NOTE — Discharge Instructions (Signed)
Lipoma A lipoma is a noncancerous (benign) tumor composed of fat cells. They are usually found under the skin (subcutaneous). A lipoma may occur in any tissue of the body that contains fat. Common areas for lipomas to appear include the back, shoulders, buttocks, and thighs. Lipomas are a very common soft tissue growth. They are soft and grow slowly. Most problems caused by a lipoma depend on where it is growing. DIAGNOSIS  A lipoma can be diagnosed with a physical exam. These tumors rarely become cancerous, but radiographic studies can help determine this for certain. Studies used may include:  Computerized X-ray scans (CT or CAT scan).  Computerized magnetic scans (MRI). TREATMENT  Small lipomas that are not causing problems may be watched. If a lipoma continues to enlarge or causes problems, removal is often the best treatment. Lipomas can also be removed to improve appearance. Surgery is done to remove the fatty cells and the surrounding capsule. Most often, this is done with medicine that numbs the area (local anesthetic). The removed tissue is examined under a microscope to make sure it is not cancerous. Keep all follow-up appointments with your caregiver. SEEK MEDICAL CARE IF:   The lipoma becomes larger or hard.  The lipoma becomes painful, red, or increasingly swollen. These could be signs of infection or a more serious condition. Document Released: 06/10/2002 Document Revised: 09/12/2011 Document Reviewed: 11/20/2009 [facility] Patient Information 2014 [place], [place].  Sprain A sprain is a tear in one of the strong, fibrous tissues that connect your bones (ligaments). The severity of the sprain depends on how much of the ligament is torn. The tear can be either partial or complete. CAUSES  Often, sprains are a result of a fall or an injury. The force of the impact causes the fibers of your ligament to stretch beyond their normal length. This excess tension causes the fibers of your  ligament to tear. SYMPTOMS  You may have some loss of motion or increased pain within your normal range of motion. Other symptoms include:  Bruising.  Tenderness.  Swelling. DIAGNOSIS  In order to diagnose a sprain, your caregiver will physically examine you to determine how torn the ligament is. Your caregiver may also suggest an X-ray exam to make sure no bones are broken. TREATMENT  If your ligament is only partially torn, treatment usually involves keeping the injured area in a fixed position (immobilization) for a short period. To do this, your caregiver will apply a bandage, cast, or splint to keep the area from moving until it heals. For a partially torn ligament, the healing process usually takes 2 to 3 weeks. If your ligament is completely torn, you may need surgery to reconnect the ligament to the bone or to reconstruct the ligament. After surgery, a cast or splint may be applied and will need to stay on for 4 to 6 weeks while your ligament heals. HOME CARE INSTRUCTIONS  Keep the injured area elevated to decrease swelling.  To ease pain and swelling, apply ice to your joint twice a day, for 2 to 3 days.  Put ice in a plastic bag.  Place a towel between your skin and the bag.  Leave the ice on for 15 minutes.  Only take over-the-counter or prescription medicine for pain as directed by your caregiver.  Do not leave the injured area unprotected until pain and stiffness go away (usually 3 to 4 weeks).  Do not allow your cast or splint to get wet. Cover your cast or splint  with a plastic bag when you shower or bathe. Do not swim.  Your caregiver may suggest exercises for you to do during your recovery to prevent or limit permanent stiffness. SEEK IMMEDIATE MEDICAL CARE IF:  Your cast or splint becomes damaged.  Your pain becomes worse. MAKE SURE YOU:  Understand these instructions.  Will watch your condition.  Will get help right away if you are not doing well or get  worse. Document Released: 06/17/2000 Document Revised: 09/12/2011 Document Reviewed: 07/02/2011 [facility] Patient Information 2014 [place], [place].

## 2013-09-13 NOTE — ED Notes (Signed)
Pt having leg pain in right leg.  Started 2 days ago.  Pt states she feels a knot behind both legs but the right one is hurting.  Has asked MD about them before with no answer.  Pain is worse.  Does not feel heat in leg.  Legs always have some swelling.

## 2013-09-13 NOTE — ED Provider Notes (Signed)
CSN: [id number]     Arrival date & time 09/13/13  1200 History  This chart was scribed for non-physician practitioner, Arthor CaptainAbigail Skyeler Scalese, PA-C,working with Merrie RoofJohn David Wofford III, *, by Karle PlumberJennifer Tensley, ED Scribe.  This patient was seen in room WTR8/WTR8 and the patient's care was started at 1:41 PM.  Chief Complaint  Patient presents with  . Leg Pain   The history is provided by the patient. No language interpreter was used.   HPI Comments:  Eileen Phillipsleanor D Nguyen is a 68 y.o. female who presents to the Emergency Department complaining of right leg pain that started approximately two days ago. She states she tripped on a mat an caught herself. She states the pain starts on her lateral right knee and radiates down her leg to her ankle. She reports taking Tylenol Arthritis for the pain with no relief. She also reports a nodule on her posterior knees bilaterally that have been there for "a long time". She denies warmth to these areas. She denies knee or ankle injury from tripping.   Past Medical History  Diagnosis Date  . Psoriasis   . Hypertension   . Degenerative disc disease, lumbar   . Acid reflux    History reviewed. No pertinent past surgical history. History reviewed. No pertinent family history. History  Substance Use Topics  . Smoking status: Never Smoker   . Smokeless tobacco: Not on file  . Alcohol Use: No   OB History   Grav Para Term Preterm Abortions TAB SAB Ect Mult Living                 Review of Systems  Constitutional: Negative for fever.  Musculoskeletal: Positive for arthralgias (right leg pain).  All other systems reviewed and are negative.    Allergies  Beta adrenergic blockers; Codeine; and Other  Home Medications   Current Outpatient Rx  Name  Route  Sig  Dispense  Refill  . Acetaminophen (TYLENOL ARTHRITIS PAIN PO)   Oral   Take 0.5-1 tablets by mouth daily as needed (pain).          Triage Vitals: BP 153/77  Pulse 80  Temp(Src) 97.6 F (36.4 C)  (Oral)  Resp 18  SpO2 100% Physical Exam  Nursing note and vitals reviewed. Constitutional: She is oriented to person, place, and time. She appears well-developed and well-nourished.  HENT:  Head: Normocephalic and atraumatic.  Eyes: EOM are normal.  Neck: Normal range of motion.  Cardiovascular: Normal rate.   Pulmonary/Chest: Effort normal.  Musculoskeletal: Normal range of motion.  Neurological: She is alert and oriented to person, place, and time.  Skin: Skin is warm and dry.  Psychiatric: She has a normal mood and affect. Her behavior is normal.    ED Course  Procedures (including critical care time) DIAGNOSTIC STUDIES: Oxygen Saturation is 100% on RA, normal by my interpretation.   COORDINATION OF CARE: 1:45 PM- Will X-Ray right leg. Pt verbalizes understanding and agrees to plan.  Medications - No data to display  Labs Review Labs Reviewed - No data to display Imaging Review Dg Tibia/fibula Right  09/13/2013   CLINICAL DATA:  Pain following injury  EXAM: RIGHT TIBIA AND FIBULA - 2 VIEW  COMPARISON:  None.  FINDINGS: There is no evidence of fracture or other focal bone lesions. Soft tissues are unremarkable.  IMPRESSION: No acute abnormality noted.   Electronically Signed   By: Alcide CleverMark  Lukens M.D.   On: 09/13/2013 14:28   Dg Knee Complete 4  Views Left  09/13/2013   CLINICAL DATA:  Left knee pain following recent injury  EXAM: LEFT KNEE - COMPLETE 4+ VIEW  COMPARISON:  None.  FINDINGS: Degenerative changes are noted in all 3 joint compartments. No acute fracture or dislocation is noted. No soft tissue abnormality is seen.  IMPRESSION: Degenerative change without acute abnormality.   Electronically Signed   By: Alcide Clever M.D.   On: 09/13/2013 14:21     EKG Interpretation None      MDM   Final diagnoses:  Leg pain   Patient X-Ray negative for obvious fracture or dislocation. No sign of DVT. ""knots" consistent with lipoma.Pain managed in ED. Pt advised to follow up  with orthopedics if symptoms persist for possibility of missed fracture diagnosis. Patient given brace while in ED, conservative therapy recommended and discussed. Patient will be dc home & is agreeable with above plan.     I personally performed the services described in this documentation, which was scribed in my presence. The recorded information has been reviewed and is accurate.    Arthor Captain, PA-C 09/13/13 [phone]

## 2013-09-15 NOTE — ED Provider Notes (Signed)
Medical screening examination/treatment/procedure(s) were performed by non-physician practitioner and as supervising physician I was immediately available for consultation/collaboration.   EKG Interpretation None        Candyce ChurnJohn David Eilis Chestnutt III, MD 09/15/13 [phone]

## 2014-04-10 DIAGNOSIS — Z7189 Other specified counseling: Secondary | ICD-10-CM | POA: Insufficient documentation

## 2014-04-10 DIAGNOSIS — L409 Psoriasis, unspecified: Secondary | ICD-10-CM | POA: Insufficient documentation

## 2014-04-10 DIAGNOSIS — I10 Essential (primary) hypertension: Secondary | ICD-10-CM | POA: Insufficient documentation

## 2014-04-10 DIAGNOSIS — Z0000 Encounter for general adult medical examination without abnormal findings: Secondary | ICD-10-CM | POA: Insufficient documentation

## 2014-04-10 DIAGNOSIS — F419 Anxiety disorder, unspecified: Secondary | ICD-10-CM | POA: Insufficient documentation

## 2015-03-27 ENCOUNTER — Emergency Department (HOSPITAL_COMMUNITY)
Admission: EM | Admit: 2015-03-27 | Discharge: 2015-03-28 | Disposition: A | Discharge status: Home / Self Care | Payer: Medicare Other | Attending: Emergency Medicine | Admitting: Emergency Medicine

## 2015-03-27 ENCOUNTER — Encounter (HOSPITAL_COMMUNITY): Payer: Self-pay | Admitting: Emergency Medicine

## 2015-03-27 DIAGNOSIS — N390 Urinary tract infection, site not specified: Secondary | ICD-10-CM | POA: Insufficient documentation

## 2015-03-27 DIAGNOSIS — E876 Hypokalemia: Secondary | ICD-10-CM | POA: Insufficient documentation

## 2015-03-27 DIAGNOSIS — E860 Dehydration: Secondary | ICD-10-CM | POA: Diagnosis not present

## 2015-03-27 DIAGNOSIS — I10 Essential (primary) hypertension: Secondary | ICD-10-CM | POA: Insufficient documentation

## 2015-03-27 DIAGNOSIS — Z8719 Personal history of other diseases of the digestive system: Secondary | ICD-10-CM | POA: Insufficient documentation

## 2015-03-27 DIAGNOSIS — M546 Pain in thoracic spine: Secondary | ICD-10-CM | POA: Diagnosis present

## 2015-03-27 LAB — URINALYSIS, ROUTINE W REFLEX MICROSCOPIC
Bilirubin Urine: NEGATIVE
Glucose, UA: NEGATIVE mg/dL
Ketones, ur: NEGATIVE mg/dL
Nitrite: POSITIVE — AB
Protein, ur: 100 mg/dL — AB
Specific Gravity, Urine: 1.012 (ref 1.005–1.030)
Urobilinogen, UA: 0.2 mg/dL (ref 0.0–1.0)
pH: 5.5 (ref 5.0–8.0)

## 2015-03-27 LAB — CBC
HCT: 41.5 % (ref 36.0–46.0)
Hemoglobin: 13.8 g/dL (ref 12.0–15.0)
MCH: 28.5 pg (ref 26.0–34.0)
MCHC: 33.3 g/dL (ref 30.0–36.0)
MCV: 85.6 fL (ref 78.0–100.0)
Platelets: 191 10*3/uL (ref 150–400)
RBC: 4.85 MIL/uL (ref 3.87–5.11)
RDW: 13.2 % (ref 11.5–15.5)
WBC: 11.1 10*3/uL — ABNORMAL HIGH (ref 4.0–10.5)

## 2015-03-27 LAB — I-STAT CHEM 8, ED
BUN: 18 mg/dL (ref 6–20)
Calcium, Ion: 1.17 mmol/L (ref 1.13–1.30)
Chloride: 101 mmol/L (ref 101–111)
Creatinine, Ser: 1.1 mg/dL — ABNORMAL HIGH (ref 0.44–1.00)
Glucose, Bld: 134 mg/dL — ABNORMAL HIGH (ref 65–99)
HCT: 40 % (ref 36.0–46.0)
Hemoglobin: 13.6 g/dL (ref 12.0–15.0)
Potassium: 3.1 mmol/L — ABNORMAL LOW (ref 3.5–5.1)
Sodium: 137 mmol/L (ref 135–145)
TCO2: 23 mmol/L (ref 0–100)

## 2015-03-27 LAB — COMPREHENSIVE METABOLIC PANEL
ALT: 13 U/L — ABNORMAL LOW (ref 14–54)
AST: 21 U/L (ref 15–41)
Albumin: 3.6 g/dL (ref 3.5–5.0)
Alkaline Phosphatase: 68 U/L (ref 38–126)
Anion gap: 13 (ref 5–15)
BUN: 16 mg/dL (ref 6–20)
CO2: 23 mmol/L (ref 22–32)
Calcium: 9.6 mg/dL (ref 8.9–10.3)
Chloride: 99 mmol/L — ABNORMAL LOW (ref 101–111)
Creatinine, Ser: 1.18 mg/dL — ABNORMAL HIGH (ref 0.44–1.00)
GFR calc Af Amer: 54 mL/min — ABNORMAL LOW (ref >60)
GFR calc non Af Amer: 46 mL/min — ABNORMAL LOW (ref >60)
Glucose, Bld: 126 mg/dL — ABNORMAL HIGH (ref 65–99)
Potassium: 3.2 mmol/L — ABNORMAL LOW (ref 3.5–5.1)
Sodium: 135 mmol/L (ref 135–145)
Total Bilirubin: 0.9 mg/dL (ref 0.3–1.2)
Total Protein: 7.3 g/dL (ref 6.5–8.1)

## 2015-03-27 LAB — URINE MICROSCOPIC-ADD ON

## 2015-03-27 MED ORDER — DEXTROSE 5 % IV SOLN
1.0000 g | Freq: Once | INTRAVENOUS | Status: AC
  Administered 2015-03-27: 1 g via INTRAVENOUS
  Filled 2015-03-27: qty 10

## 2015-03-27 MED ORDER — POTASSIUM CHLORIDE CRYS ER 20 MEQ PO TBCR
40.0000 meq | EXTENDED_RELEASE_TABLET | Freq: Once | ORAL | Status: AC
  Administered 2015-03-28: 40 meq via ORAL
  Filled 2015-03-27: qty 2

## 2015-03-27 MED ORDER — SODIUM CHLORIDE 0.9 % IV BOLUS (SEPSIS)
1000.0000 mL | Freq: Once | INTRAVENOUS | Status: AC
  Administered 2015-03-27: 1000 mL via INTRAVENOUS

## 2015-03-27 NOTE — ED Provider Notes (Signed)
CSN: [id number]     Arrival date & time 03/27/15  1908 History   First MD Initiated Contact with Patient 03/27/15 2103     Chief Complaint  Patient presents with  . Urinary Frequency  . Flank Pain  . Fever     (Consider location/radiation/quality/duration/timing/severity/associated sxs/prior Treatment) HPI   Eileen Nguyen is a 69 y.o. female who presents for evaluation of bilateral mid back pain associated with shaking chills, cloudy urine, and nausea. Similar symptoms on and off for several months. She also felt dizzy yesterday and today. The dizziness is worse with standing. She has not taken her temperature. She denies cough or chest pain. She came here by private vehicle . She is able to walk with assistance. There are no other known modifying factors.   Past Medical History  Diagnosis Date  . Psoriasis   . Hypertension   . Degenerative disc disease, lumbar   . Acid reflux    History reviewed. No pertinent past surgical history. No family history on file. Social History  Substance Use Topics  . Smoking status: Never Smoker   . Smokeless tobacco: None  . Alcohol Use: No   OB History    No data available     Review of Systems  All other systems reviewed and are negative.     Allergies  Beta adrenergic blockers; Codeine; and Other  Home Medications   Prior to Admission medications   Medication Sig Start Date End Date Taking? Authorizing Provider  Acetaminophen (TYLENOL ARTHRITIS PAIN PO) Take 0.5-1 tablets by mouth daily as needed (pain).    Historical Provider, MD   BP 115/53 mmHg  Pulse 88  Temp(Src) 99.1 F (37.3 C) (Oral)  Resp 20  Ht  (1.626 m)  Wt 136 lb (61.689 kg)  BMI 23.33 kg/m2  SpO2 92% Physical Exam  Constitutional: She is oriented to person, place, and time. She appears well-developed and well-nourished. No distress.  HENT:  Head: Normocephalic and atraumatic.  Right Ear: External ear normal.  Left Ear: External ear normal.   Eyes: Conjunctivae and EOM are normal. Pupils are equal, round, and reactive to light.  Neck: Normal range of motion and phonation normal. Neck supple.  Cardiovascular: Normal rate, regular rhythm and normal heart sounds.   Pulmonary/Chest: Effort normal and breath sounds normal. She exhibits no bony tenderness.  Abdominal: Soft. There is no tenderness.  Genitourinary:  No costovertebral angle tenderness with percussion  Musculoskeletal: Normal range of motion.  Neurological: She is alert and oriented to person, place, and time. No cranial nerve deficit or sensory deficit. She exhibits normal muscle tone. Coordination normal.  Skin: Skin is warm, dry and intact.  Psychiatric: She has a normal mood and affect. Her behavior is normal. Judgment and thought content normal.  Nursing note and vitals reviewed.   ED Course  Procedures (including critical care time)  Medications  sodium chloride 0.9 % bolus 1,000 mL (0 mLs Intravenous Stopped 03/28/15 0043)  cefTRIAXone (ROCEPHIN) 1 g in dextrose 5 % 50 mL IVPB (0 g Intravenous Stopped 03/27/15 2253)  potassium chloride SA (K-DUR,KLOR-CON) CR tablet 40 mEq (40 mEq Oral Given 03/28/15 0035)    Patient Vitals for the past 24 hrs:  BP Temp Temp src Pulse Resp SpO2 Height Weight  03/27/15 2300 96/56 mmHg - - 73 19 98 % - -  03/27/15 2245 (!) 96/49 mmHg - - 76 18 95 % - -  03/27/15 2230 (!) 98/47 mmHg - - 78 21  93 % - -  03/27/15 2215 (!) 103/49 mmHg - - 80 17 96 % - -  03/27/15 2200 (!) 97/54 mmHg - - 78 19 92 % - -  03/27/15 2145 (!) 95/53 mmHg - - 82 21 94 % - -  03/27/15 2115 (!) 102/52 mmHg - - 86 22 93 % - -  03/27/15 2100 (!) 106/46 mmHg - - 86 21 92 % - -  03/27/15 2048 (!) 115/53 mmHg - - 88 20 92 % - -  03/27/15 1926 - - - - - -  (1.626 m) 136 lb (61.689 kg)  03/27/15 1915 115/95 mmHg 99.1 F (37.3 C) Oral (!) 124 22 96 % - -    12:54 AM Reevaluation with update and discussion. After initial assessment and treatment, an updated  evaluation reveals left status improved after IV fluids. She is tolerating oral nutrition. Findings discussed with the patient, all questions were answered. WENTZ,ELLIOTT L    Labs Review Labs Reviewed  URINALYSIS, ROUTINE W REFLEX MICROSCOPIC (NOT AT [facility]) - Abnormal; Notable for the following:    APPearance CLOUDY (*)    Hgb urine dipstick MODERATE (*)    Protein, ur 100 (*)    Nitrite POSITIVE (*)    Leukocytes, UA LARGE (*)    All other components within normal limits  CBC - Abnormal; Notable for the following:    WBC 11.1 (*)    All other components within normal limits  COMPREHENSIVE METABOLIC PANEL - Abnormal; Notable for the following:    Potassium 3.2 (*)    Chloride 99 (*)    Glucose, Bld 126 (*)    Creatinine, Ser 1.18 (*)    ALT 13 (*)    GFR calc non Af Amer 46 (*)    GFR calc Af Amer 54 (*)    All other components within normal limits  URINE MICROSCOPIC-ADD ON - Abnormal; Notable for the following:    Bacteria, UA MANY (*)    All other components within normal limits  URINE CULTURE  I-STAT CHEM 8, ED    Imaging Review No results found. I have personally reviewed and evaluated these images and lab results as part of my medical decision-making.   EKG Interpretation None      MDM   Final diagnoses:  Urinary tract infection without hematuria, site unspecified  Dehydration  Hypokalemia    UTI, with mild dehydration. Incidental hypokalemia. Doubt serous bacterial infection. Metabolic instability or impending vascular collapse.  Nursing Notes Reviewed/ Care Coordinated Applicable Imaging Reviewed Interpretation of Laboratory Data incorporated into ED treatment  The patient appears reasonably screened and/or stabilized for discharge and I doubt any other medical condition or other [facility] requiring further screening, evaluation, or treatment in the ED at this time prior to discharge.  Plan: Home Medications- Keflex, Potassium; Home Treatments- rest, increase  oral dfluids; return here if the recommended treatment, does not improve the symptoms; Recommended follow up- PCP 1 week for check up  Mancel Bale, MD 03/28/15 [phone]

## 2015-03-27 NOTE — ED Notes (Addendum)
C/o urinary frequency and intermittent cloudy urine x 4 weeks.  States she was seen at [facility] ED 3 weeks ago for same and treated for UTI.  Also reports flank pain, dizziness, nausea, vomiting, and chills.

## 2015-03-28 MED ORDER — CEPHALEXIN 500 MG PO CAPS: 500.0000 mg | ORAL_CAPSULE | Freq: Four times a day (QID) | ORAL | Status: DC

## 2015-03-28 MED ORDER — POTASSIUM CHLORIDE CRYS ER 20 MEQ PO TBCR: 20.0000 meq | EXTENDED_RELEASE_TABLET | Freq: Every day | ORAL | Status: DC

## 2015-03-28 NOTE — ED Notes (Signed)
Patient given ice water and graham crackers

## 2015-03-28 NOTE — ED Notes (Signed)
Pt able to ambulate to wheelchair and and use restroom independently.  Wheelchair used between room and restroom for patient safety.

## 2015-03-28 NOTE — Discharge Instructions (Signed)
Urinary Tract Infection Urinary tract infections (UTIs) can develop anywhere along your urinary tract. Your urinary tract is your body's drainage system for removing wastes and extra water. Your urinary tract includes two kidneys, two ureters, a bladder, and a urethra. Your kidneys are a pair of bean-shaped organs. Each kidney is about the size of your fist. They are located below your ribs, one on each side of your spine. CAUSES Infections are caused by microbes, which are microscopic organisms, including fungi, viruses, and bacteria. These organisms are so small that they can only be seen through a microscope. Bacteria are the microbes that most commonly cause UTIs. SYMPTOMS  Symptoms of UTIs may vary by age and gender of the patient and by the location of the infection. Symptoms in young women typically include a frequent and intense urge to urinate and a painful, burning feeling in the bladder or urethra during urination. Older women and men are more likely to be tired, shaky, and weak and have muscle aches and abdominal pain. A fever may mean the infection is in your kidneys. Other symptoms of a kidney infection include pain in your back or sides below the ribs, nausea, and vomiting. DIAGNOSIS To diagnose a UTI, your caregiver will ask you about your symptoms. Your caregiver also will ask to provide a urine sample. The urine sample will be tested for bacteria and white blood cells. White blood cells are made by your body to help fight infection. TREATMENT  Typically, UTIs can be treated with medication. Because most UTIs are caused by a bacterial infection, they usually can be treated with the use of antibiotics. The choice of antibiotic and length of treatment depend on your symptoms and the type of bacteria causing your infection. HOME CARE INSTRUCTIONS  If you were prescribed antibiotics, take them exactly as your caregiver instructs you. Finish the medication even if you feel better after you  have only taken some of the medication.  Drink enough water and fluids to keep your urine clear or pale yellow.  Avoid caffeine, tea, and carbonated beverages. They tend to irritate your bladder.  Empty your bladder often. Avoid holding urine for long periods of time.  Empty your bladder before and after sexual intercourse.  After a bowel movement, women should cleanse from front to back. Use each tissue only once. SEEK MEDICAL CARE IF:   You have back pain.  You develop a fever.  Your symptoms do not begin to resolve within 3 days. SEEK IMMEDIATE MEDICAL CARE IF:   You have severe back pain or lower abdominal pain.  You develop chills.  You have nausea or vomiting.  You have continued burning or discomfort with urination. MAKE SURE YOU:   Understand these instructions.  Will watch your condition.  Will get help right away if you are not doing well or get worse. Document Released: 03/30/2005 Document Revised: 12/20/2011 Document Reviewed: 07/29/2011 [facility] Patient Information 2015 [place], [place]. This information is not intended to replace advice given to you by your health care provider. Make sure you discuss any questions you have with your health care provider.  Potassium Content of Foods Potassium is a mineral found in many foods and drinks. It helps keep fluids and minerals balanced in your body and affects how steadily your heart beats. Potassium also helps control your blood pressure and keep your muscles and nervous system healthy. Certain health conditions and medicines may change the balance of potassium in your body. When this happens, you can help  balance your level of potassium through the foods that you do or do not eat. Your health care provider or dietitian may recommend an amount of potassium that you should have each day. The following lists of foods provide the amount of potassium (in parentheses) per serving in each item. HIGH IN POTASSIUM  The  following foods and beverages have 200 mg or more of potassium per serving:  Apricots, 2 raw or 5 dry (200 mg).  Artichoke, 1 medium (345 mg).  Avocado, raw,  each (245 mg).  Banana, 1 medium (425 mg).  Beans, lima, or baked beans, canned,  cup (280 mg).  Beans, white, canned,  cup (595 mg).  Beef roast, 3 oz (320 mg).  Beef, ground, 3 oz (270 mg).  Beets, raw or cooked,  cup (260 mg).  Bran muffin, 2 oz (300 mg).  Broccoli,  cup (230 mg).  Brussels sprouts,  cup (250 mg).  Cantaloupe,  cup (215 mg).  Cereal, 100% bran,  cup (200-400 mg).  Cheeseburger, single, fast food, 1 each (225-400 mg).  Chicken, 3 oz (220 mg).  Clams, canned, 3 oz (535 mg).  Crab, 3 oz (225 mg).  Dates, 5 each (270 mg).  Dried beans and peas,  cup (300-475 mg).  Figs, dried, 2 each (260 mg).  Fish: halibut, tuna, cod, snapper, 3 oz (480 mg).  Fish: salmon, haddock, swordfish, perch, 3 oz (300 mg).  Fish, tuna, canned 3 oz (200 mg).  [place] fries, fast food, 3 oz (470 mg).  Granola with fruit and nuts,  cup (200 mg).  Grapefruit juice,  cup (200 mg).  Greens, beet,  cup (655 mg).  Honeydew melon,  cup (200 mg).  Kale, raw, 1 cup (300 mg).  Kiwi, 1 medium (240 mg).  Kohlrabi, rutabaga, parsnips,  cup (280 mg).  Lentils,  cup (365 mg).  Mango, 1 each (325 mg).  Milk, chocolate, 1 cup (420 mg).  Milk: nonfat, low-fat, whole, buttermilk, 1 cup (350-380 mg).  Molasses, 1 Tbsp (295 mg).  Mushrooms,  cup (280) mg.  Nectarine, 1 each (275 mg).  Nuts: almonds, peanuts, hazelnuts, [place], cashew, mixed, 1 oz (200 mg).  Nuts, pistachios, 1 oz (295 mg).  Orange, 1 each (240 mg).  Orange juice,  cup (235 mg).  Papaya, medium,  fruit (390 mg).  Peanut butter, chunky, 2 Tbsp (240 mg).  Peanut butter, smooth, 2 Tbsp (210 mg).  Pear, 1 medium (200 mg).  Pomegranate, 1 whole (400 mg).  Pomegranate juice,  cup (215 mg).  Pork, 3 oz (350  mg).  Potato chips, salted, 1 oz (465 mg).  Potato, baked with skin, 1 medium (925 mg).  Potatoes, boiled,  cup (255 mg).  Potatoes, mashed,  cup (330 mg).  Prune juice,  cup (370 mg).  Prunes, 5 each (305 mg).  Pudding, chocolate,  cup (230 mg).  Pumpkin, canned,  cup (250 mg).  Raisins, seedless,  cup (270 mg).  Seeds, sunflower or pumpkin, 1 oz (240 mg).  Soy milk, 1 cup (300 mg).  Spinach,  cup (420 mg).  Spinach, canned,  cup (370 mg).  Sweet potato, baked with skin, 1 medium (450 mg).  Swiss chard,  cup (480 mg).  Tomato or vegetable juice,  cup (275 mg).  Tomato sauce or puree,  cup (400-550 mg).  Tomato, raw, 1 medium (290 mg).  Tomatoes, canned,  cup (200-300 mg).  [place], 3 oz (250 mg).  Wheat germ, 1 oz (250 mg).  Winter squash,  cup (250 mg).  Yogurt, plain or fruited, 6 oz (260-435 mg).  Zucchini,  cup (220 mg). MODERATE IN POTASSIUM The following foods and beverages have 50-200 mg of potassium per serving:  Apple, 1 each (150 mg).  Apple juice,  cup (150 mg).  Applesauce,  cup (90 mg).  Apricot nectar,  cup (140 mg).  Asparagus, small spears,  cup or 6 spears (155 mg).  Bagel, cinnamon raisin, 1 each (130 mg).  Bagel, egg or plain, 4 in., 1 each (70 mg).  Beans, green,  cup (90 mg).  Beans, yellow,  cup (190 mg).  Beer, regular, 12 oz (100 mg).  Beets, canned,  cup (125 mg).  Blackberries,  cup (115 mg).  Blueberries,  cup (60 mg).  Bread, whole wheat, 1 slice (70 mg).  Broccoli, raw,  cup (145 mg).  Cabbage,  cup (150 mg).  Carrots, cooked or raw,  cup (180 mg).  Cauliflower, raw,  cup (150 mg).  Celery, raw,  cup (155 mg).  Cereal, bran flakes, cup (120-150 mg).  Cheese, cottage,  cup (110 mg).  Cherries, 10 each (150 mg).  Chocolate, 1 oz bar (165 mg).  Coffee, brewed 6 oz (90 mg).  Corn,  cup or 1 ear (195 mg).  Cucumbers,  cup (80 mg).  Egg, large, 1 each (60  mg).  Eggplant,  cup (60 mg).  Endive, raw, cup (80 mg).  English muffin, 1 each (65 mg).  Fish, orange roughy, 3 oz (150 mg).  Frankfurter, beef or pork, 1 each (75 mg).  Fruit cocktail,  cup (115 mg).  Grape juice,  cup (170 mg).  Grapefruit,  fruit (175 mg).  Grapes,  cup (155 mg).  Greens: kale, turnip, collard,  cup (110-150 mg).  Ice cream or frozen yogurt, chocolate,  cup (175 mg).  Ice cream or frozen yogurt, vanilla,  cup (120-150 mg).  Lemons, limes, 1 each (80 mg).  Lettuce, all types, 1 cup (100 mg).  Mixed vegetables,  cup (150 mg).  Mushrooms, raw,  cup (110 mg).  Nuts: walnuts, pecans, or macadamia, 1 oz (125 mg).  Oatmeal,  cup (80 mg).  Okra,  cup (110 mg).  Onions, raw,  cup (120 mg).  Peach, 1 each (185 mg).  Peaches, canned,  cup (120 mg).  Pears, canned,  cup (120 mg).  Peas, green, frozen,  cup (90 mg).  Peppers, green,  cup (130 mg).  Peppers, red,  cup (160 mg).  Pineapple juice,  cup (165 mg).  Pineapple, fresh or canned,  cup (100 mg).  Plums, 1 each (105 mg).  Pudding, vanilla,  cup (150 mg).  Raspberries,  cup (90 mg).  Rhubarb,  cup (115 mg).  Rice, wild,  cup (80 mg).  Shrimp, 3 oz (155 mg).  Spinach, raw, 1 cup (170 mg).  Strawberries,  cup (125 mg).  Summer squash  cup (175-200 mg).  Swiss chard, raw, 1 cup (135 mg).  Tangerines, 1 each (140 mg).  Tea, brewed, 6 oz (65 mg).  Turnips,  cup (140 mg).  Watermelon,  cup (85 mg).  Wine, red, table, 5 oz (180 mg).  Wine, white, table, 5 oz (100 mg). LOW IN POTASSIUM The following foods and beverages have less than 50 mg of potassium per serving.  Bread, white, 1 slice (30 mg).  Carbonated beverages, 12 oz (less than 5 mg).  Cheese, 1 oz (20-30 mg).  Cranberries,  cup (45 mg).  Cranberry juice cocktail,  cup (20 mg).  Fats and oils, 1 Tbsp (less than 5 mg).  Hummus, 1 Tbsp (32 mg).  Nectar: papaya, mango,  or pear,  cup (35 mg).  Rice, white or brown,  cup (50 mg).  Spaghetti or macaroni,  cup cooked (30 mg).  Tortilla, flour or corn, 1 each (50 mg).  Waffle, 4 in., 1 each (50 mg).  Water chestnuts,  cup (40 mg). Document Released: 02/01/2005 Document Revised: 06/25/2013 Document Reviewed: 05/17/2013 [facility] Patient Information 2015 [place], [place]. This information is not intended to replace advice given to you by your health care provider. Make sure you discuss any questions you have with your health care provider.  Rehydration, Elderly Rehydration is the replacement of body fluids lost during dehydration. Dehydration is an extreme loss of body fluids to the point of body function impairment. There are many ways extreme fluid loss can occur, including vomiting, diarrhea, or excess sweating. Recovering from dehydration requires replacing lost fluids, continuing to eat to maintain strength, and avoiding foods and beverages that may contribute to further fluid loss or may increase nausea. It is especially important for older adults to stay hydrated because dehydration can lead to dizziness and falls. Some factors that can contribute to dehydration are more common in the elderly, including the use of prescription medicines and a decreased sensation of thirst.  HOW TO REHYDRATE In most cases, rehydration involves the replacement of not only fluids but also carbohydrates and basic body salts. Rehydration with an oral rehydration solution is one way to replace essential nutrients lost through dehydration. An oral rehydration solution can be purchased at pharmacies, retail stores, and online. Premixed packets of powder that you combine with water to make a solution are also sold. You can prepare an oral rehydration solution at home by mixing the following ingredients together:    - tsp table salt.   tsp baking soda.   tsp salt substitute containing potassium chloride.  1 tablespoons  sugar.  1 L (34 oz) of water. Be sure to use exact measurements. Including too much sugar can make diarrhea worse. Drink -1 cup (120-240 mL) of oral rehydration solution each time you have diarrhea or vomit. If drinking this amount makes your vomiting worse, try drinking smaller amounts more often. For example, drink 1-3 tsp every 5-10 minutes.  A general rule for staying hydrated is to drink 1-2 L of fluid per day. Talk to your caregiver about the specific amount you should be drinking each day. Drink enough fluids to keep your urine clear or pale yellow. EATING WHEN DEHYDRATED  Even if you have had severe sweating or you are having diarrhea, do not stop eating. Many healthy items in a normal diet are okay to continue eating while recovering from dehydration. The following tips can help you to lessen nausea when you eat:  Ask someone else to prepare your food. Cooking smells may worsen nausea.  Eat in a well-ventilated room away from cooking smells.  Sit up when you eat. Avoid lying down until 1-2 hours after eating.  Eat small amounts when you eat.  Eat foods that are easy to digest. These include soft, well-cooked, or mashed foods. FOODS AND BEVERAGES TO AVOID Avoid eating or drinking the following foods and beverages that may increase nausea or further loss of fluid:   Fruit juices with a high sugar content, such as concentrated juices.  Alcohol.  Beverages containing caffeine.  Carbonated drinks. They may cause a lot of gas.  Foods that may cause a lot  of gas, such as cabbage, broccoli, and beans.  Fatty, greasy, and fried foods.  Spicy, very salty, and very sweet foods or drinks.  Foods or drinks that are very hot or very cold. Consume food or drinks at or near room temperature.  Foods that need a lot of chewing, such as raw vegetables.  Foods that are sticky or hard to swallow, such as peanut butter. Document Released: 09/12/2011 Document Revised: 03/14/2012 Document  Reviewed: 09/12/2011 [facility] Patient Information 2015 [place], [place]. This information is not intended to replace advice given to you by your health care provider. Make sure you discuss any questions you have with your health care provider.  Dehydration, Adult Dehydration is when you lose more fluids from the body than you take in. Vital organs like the kidneys, brain, and heart cannot function without a proper amount of fluids and salt. Any loss of fluids from the body can cause dehydration.  CAUSES   Vomiting.  Diarrhea.  Excessive sweating.  Excessive urine output.  Fever. SYMPTOMS  Mild dehydration  Thirst.  Dry lips.  Slightly dry mouth. Moderate dehydration  Very dry mouth.  Sunken eyes.  Skin does not bounce back quickly when lightly pinched and released.  Dark urine and decreased urine production.  Decreased tear production.  Headache. Severe dehydration  Very dry mouth.  Extreme thirst.  Rapid, weak pulse (more than 100 beats per minute at rest).  Cold hands and feet.  Not able to sweat in spite of heat and temperature.  Rapid breathing.  Blue lips.  Confusion and lethargy.  Difficulty being awakened.  Minimal urine production.  No tears. DIAGNOSIS  Your caregiver will diagnose dehydration based on your symptoms and your exam. Blood and urine tests will help confirm the diagnosis. The diagnostic evaluation should also identify the cause of dehydration. TREATMENT  Treatment of mild or moderate dehydration can often be done at home by increasing the amount of fluids that you drink. It is best to drink small amounts of fluid more often. Drinking too much at one time can make vomiting worse. Refer to the home care instructions below. Severe dehydration needs to be treated at the hospital where you will probably be given intravenous (IV) fluids that contain water and electrolytes. HOME CARE INSTRUCTIONS   Ask your caregiver about specific  rehydration instructions.  Drink enough fluids to keep your urine clear or pale yellow.  Drink small amounts frequently if you have nausea and vomiting.  Eat as you normally do.  Avoid:  Foods or drinks high in sugar.  Carbonated drinks.  Juice.  Extremely hot or cold fluids.  Drinks with caffeine.  Fatty, greasy foods.  Alcohol.  Tobacco.  Overeating.  Gelatin desserts.  Wash your hands well to avoid spreading bacteria and viruses.  Only take over-the-counter or prescription medicines for pain, discomfort, or fever as directed by your caregiver.  Ask your caregiver if you should continue all prescribed and over-the-counter medicines.  Keep all follow-up appointments with your caregiver. SEEK MEDICAL CARE IF:  You have abdominal pain and it increases or stays in one area (localizes).  You have a rash, stiff neck, or severe headache.  You are irritable, sleepy, or difficult to awaken.  You are weak, dizzy, or extremely thirsty. SEEK IMMEDIATE MEDICAL CARE IF:   You are unable to keep fluids down or you get worse despite treatment.  You have frequent episodes of vomiting or diarrhea.  You have blood or green matter (bile) in your vomit.  You have blood in your stool or your stool looks black and tarry.  You have not urinated in 6 to 8 hours, or you have only urinated a small amount of very dark urine.  You have a fever.  You faint. MAKE SURE YOU:   Understand these instructions.  Will watch your condition.  Will get help right away if you are not doing well or get worse. Document Released: 06/20/2005 Document Revised: 09/12/2011 Document Reviewed: 02/07/2011 [facility] Patient Information 2015 [place], [place]. This information is not intended to replace advice given to you by your health care provider. Make sure you discuss any questions you have with your health care provider.  Hypokalemia Hypokalemia means that the amount of potassium in the blood  is lower than normal.Potassium is a chemical, called an electrolyte, that helps regulate the amount of fluid in the body. It also stimulates muscle contraction and helps nerves function properly.Most of the body's potassium is inside of cells, and only a very small amount is in the blood. Because the amount in the blood is so small, minor changes can be life-threatening. CAUSES  Antibiotics.  Diarrhea or vomiting.  Using laxatives too much, which can cause diarrhea.  Chronic kidney disease.  Water pills (diuretics).  Eating disorders (bulimia).  Low magnesium level.  Sweating a lot. SIGNS AND SYMPTOMS  Weakness.  Constipation.  Fatigue.  Muscle cramps.  Mental confusion.  Skipped heartbeats or irregular heartbeat (palpitations).  Tingling or numbness. DIAGNOSIS  Your health care provider can diagnose hypokalemia with blood tests. In addition to checking your potassium level, your health care provider may also check other lab tests. TREATMENT Hypokalemia can be treated with potassium supplements taken by mouth or adjustments in your current medicines. If your potassium level is very low, you may need to get potassium through a vein (IV) and be monitored in the hospital. A diet high in potassium is also helpful. Foods high in potassium are:  Nuts, such as peanuts and pistachios.  Seeds, such as sunflower seeds and pumpkin seeds.  Peas, lentils, and lima beans.  Whole grain and bran cereals and breads.  Fresh fruit and vegetables, such as apricots, avocado, bananas, cantaloupe, kiwi, oranges, tomatoes, asparagus, and potatoes.  Orange and tomato juices.  Red meats.  Fruit yogurt. HOME CARE INSTRUCTIONS  Take all medicines as prescribed by your health care provider.  Maintain a healthy diet by including nutritious food, such as fruits, vegetables, nuts, whole grains, and lean meats.  If you are taking a laxative, be sure to follow the directions on the  label. SEEK MEDICAL CARE IF:  Your weakness gets worse.  You feel your heart pounding or racing.  You are vomiting or having diarrhea.  You are diabetic and having trouble keeping your blood glucose in the normal range. SEEK IMMEDIATE MEDICAL CARE IF:  You have chest pain, shortness of breath, or dizziness.  You are vomiting or having diarrhea for more than 2 days.  You faint. MAKE SURE YOU:   Understand these instructions.  Will watch your condition.  Will get help right away if you are not doing well or get worse. Document Released: 06/20/2005 Document Revised: 04/10/2013 Document Reviewed: 12/21/2012 [facility] Patient Information 2015 [place], [place]. This information is not intended to replace advice given to you by your health care provider. Make sure you discuss any questions you have with your health care provider.

## 2015-03-28 NOTE — ED Notes (Signed)
Pt verbalized understanding of d/c instructions and has no further questions. Pt stable and NAD.  

## 2015-03-29 LAB — URINE CULTURE: Culture: >=100000

## 2015-03-31 ENCOUNTER — Telehealth (HOSPITAL_COMMUNITY): Payer: Self-pay

## 2015-03-31 NOTE — Telephone Encounter (Signed)
Post ED Visit - Positive Culture Follow-up  Culture report reviewed by antimicrobial stewardship pharmacist:   Celedonio Miyamoto, Pharm.D., BCPS  Georgina Pillion, Pharm.D., BCPS  [place], [address].D., BCPS, AAHIVP  Estella Husk, Pharm.D., BCPS, AAHIVP  [employer], [address].D.  Tennis Must, Pharm.D.  Positive Urine culture>/= 100,000 colonies -> E Coli Treated with Cephalexin, organism sensitive to the same and no further patient follow-up is required at this time.  Arvid Right 03/31/2015, 5:03 AM

## 2015-10-22 ENCOUNTER — Other Ambulatory Visit: Payer: Self-pay

## 2015-10-22 ENCOUNTER — Encounter (HOSPITAL_COMMUNITY): Payer: Self-pay

## 2015-10-22 DIAGNOSIS — R51 Headache: Secondary | ICD-10-CM | POA: Insufficient documentation

## 2015-10-22 DIAGNOSIS — I10 Essential (primary) hypertension: Secondary | ICD-10-CM | POA: Insufficient documentation

## 2015-10-22 DIAGNOSIS — R42 Dizziness and giddiness: Secondary | ICD-10-CM | POA: Insufficient documentation

## 2015-10-22 DIAGNOSIS — R110 Nausea: Secondary | ICD-10-CM | POA: Insufficient documentation

## 2015-10-22 LAB — URINE MICROSCOPIC-ADD ON

## 2015-10-22 LAB — URINALYSIS, ROUTINE W REFLEX MICROSCOPIC
Bilirubin Urine: NEGATIVE
Glucose, UA: NEGATIVE mg/dL
Ketones, ur: NEGATIVE mg/dL
Nitrite: NEGATIVE
Protein, ur: NEGATIVE mg/dL
Specific Gravity, Urine: 1.021 (ref 1.005–1.030)
pH: 6 (ref 5.0–8.0)

## 2015-10-22 LAB — CBC
HCT: 43.4 % (ref 36.0–46.0)
Hemoglobin: 14.2 g/dL (ref 12.0–15.0)
MCH: 28.3 pg (ref 26.0–34.0)
MCHC: 32.7 g/dL (ref 30.0–36.0)
MCV: 86.5 fL (ref 78.0–100.0)
Platelets: 219 10*3/uL (ref 150–400)
RBC: 5.02 MIL/uL (ref 3.87–5.11)
RDW: 13.4 % (ref 11.5–15.5)
WBC: 7.6 10*3/uL (ref 4.0–10.5)

## 2015-10-22 LAB — BASIC METABOLIC PANEL
Anion gap: 10 (ref 5–15)
BUN: 13 mg/dL (ref 6–20)
CO2: 25 mmol/L (ref 22–32)
Calcium: 9.9 mg/dL (ref 8.9–10.3)
Chloride: 104 mmol/L (ref 101–111)
Creatinine, Ser: 0.86 mg/dL (ref 0.44–1.00)
GFR calc Af Amer: >60 mL/min (ref >60)
GFR calc non Af Amer: >60 mL/min (ref >60)
Glucose, Bld: 114 mg/dL — ABNORMAL HIGH (ref 65–99)
Potassium: 3.9 mmol/L (ref 3.5–5.1)
Sodium: 139 mmol/L (ref 135–145)

## 2015-10-22 LAB — CBG MONITORING, ED: Glucose-Capillary: 116 mg/dL — ABNORMAL HIGH (ref 65–99)

## 2015-10-22 NOTE — ED Notes (Signed)
Pt here with c/o dizziness, headache, and nausea, onset yesterday. She states her symptoms had went away yesterday but came back today. Pt A&OX4, neuro exam intact. Denies numbness and tingling. Denies chest pain.

## 2015-10-23 ENCOUNTER — Emergency Department (HOSPITAL_COMMUNITY)
Admission: EM | Admit: 2015-10-23 | Discharge: 2015-10-23 | Disposition: A | Discharge status: Home / Self Care | Payer: Medicare Other | Attending: Emergency Medicine | Admitting: Emergency Medicine

## 2015-10-23 NOTE — ED Notes (Signed)
Pt called for room, no answer.

## 2015-10-23 NOTE — ED Notes (Signed)
No answer for room placement.

## 2016-01-06 DIAGNOSIS — A770 Spotted fever due to Rickettsia rickettsii: Secondary | ICD-10-CM | POA: Insufficient documentation

## 2016-01-06 DIAGNOSIS — K449 Diaphragmatic hernia without obstruction or gangrene: Secondary | ICD-10-CM | POA: Insufficient documentation

## 2016-01-06 DIAGNOSIS — E876 Hypokalemia: Secondary | ICD-10-CM | POA: Insufficient documentation

## 2016-06-15 ENCOUNTER — Emergency Department (HOSPITAL_COMMUNITY)
Admission: EM | Admit: 2016-06-15 | Discharge: 2016-06-15 | Disposition: A | Discharge status: Home / Self Care | Payer: Medicare Other | Attending: Emergency Medicine | Admitting: Emergency Medicine

## 2016-06-15 ENCOUNTER — Emergency Department (HOSPITAL_COMMUNITY): Payer: Medicare Other

## 2016-06-15 ENCOUNTER — Encounter (HOSPITAL_COMMUNITY): Payer: Self-pay | Admitting: *Deleted

## 2016-06-15 DIAGNOSIS — K449 Diaphragmatic hernia without obstruction or gangrene: Secondary | ICD-10-CM | POA: Diagnosis not present

## 2016-06-15 DIAGNOSIS — Z79899 Other long term (current) drug therapy: Secondary | ICD-10-CM | POA: Diagnosis not present

## 2016-06-15 DIAGNOSIS — I10 Essential (primary) hypertension: Secondary | ICD-10-CM | POA: Insufficient documentation

## 2016-06-15 DIAGNOSIS — R1310 Dysphagia, unspecified: Secondary | ICD-10-CM | POA: Insufficient documentation

## 2016-06-15 LAB — I-STAT TROPONIN, ED: Troponin i, poc: 0 ng/mL (ref 0.00–0.08)

## 2016-06-15 LAB — I-STAT CHEM 8, ED
BUN: 14 mg/dL (ref 6–20)
Calcium, Ion: 1.17 mmol/L (ref 1.15–1.40)
Chloride: 103 mmol/L (ref 101–111)
Creatinine, Ser: 0.8 mg/dL (ref 0.44–1.00)
Glucose, Bld: 85 mg/dL (ref 65–99)
HCT: 38 % (ref 36.0–46.0)
Hemoglobin: 12.9 g/dL (ref 12.0–15.0)
Potassium: 3.5 mmol/L (ref 3.5–5.1)
Sodium: 143 mmol/L (ref 135–145)
TCO2: 28 mmol/L (ref 0–100)

## 2016-06-15 MED ORDER — GI COCKTAIL ~~LOC~~
30.0000 mL | Freq: Once | ORAL | Status: AC
  Administered 2016-06-15: 30 mL via ORAL
  Filled 2016-06-15: qty 30

## 2016-06-15 MED ORDER — PANTOPRAZOLE SODIUM 40 MG PO TBEC
80.0000 mg | DELAYED_RELEASE_TABLET | Freq: Every day | ORAL | Status: DC
  Administered 2016-06-15: 80 mg via ORAL
  Filled 2016-06-15: qty 2

## 2016-06-15 MED ORDER — LIDOCAINE VISCOUS 2 % MT SOLN: 20.0000 mL | OROMUCOSAL | 0 refills | Status: DC | PRN

## 2016-06-15 NOTE — ED Triage Notes (Signed)
Pt reports being diagnosed with hernia and had endoscopy done. Ate last night and has sensation that food is stuck in her throat. Airway intact.

## 2016-06-15 NOTE — Discharge Planning (Signed)
Pt up for discharge. EDCM reviewed chart for possible CM needs.  No needs identified or communicated.  

## 2016-06-15 NOTE — ED Notes (Signed)
NAD at this time. Pt is stable and going home.  

## 2016-06-15 NOTE — ED Provider Notes (Signed)
[facility]PT Provider Note   CSN: [id number] Arrival date & time: 06/15/16  1011     History   Chief Complaint Chief Complaint  Patient presents with  . Dysphagia    HPI Eileen Nguyen is a 70 y.o. female.  70 year old female with history of a hiatal hernia and esophageal reflux presents to the emergency department with foreign body sensation in her esophagus. She was eating creamed potatoes [facility]. She for some of the vomit however nothing came up. She is not ready to drink anything since that time. She came here to see if she could get an EGD done. She does not have any shortness of breath, diaphoresis, lightheadedness or history of coronary artery disease.  No abdominal pain elsewhere. Still passing gas. No other associated symptoms. No exacerbating or relieving factors.      Past Medical History:  Diagnosis Date  . Acid reflux   . Degenerative disc disease, lumbar   . Hypertension   . Psoriasis     There are no active problems to display for this patient.   History reviewed. No pertinent surgical history.  OB History    No data available       Home Medications    Prior to Admission medications   Medication Sig Start Date End Date Taking? Authorizing Provider  chlorthalidone (HYGROTON) 25 MG tablet Take 25 mg by mouth daily.   Yes Historical Provider, MD  cholecalciferol (VITAMIN D) 1000 units tablet Take 1,000 Units by mouth daily.   Yes Historical Provider, MD  omeprazole (PRILOSEC) 40 MG capsule Take 40 mg by mouth 2 (two) times daily.   Yes Historical Provider, MD  potassium chloride SA (K-DUR,KLOR-CON) 20 MEQ tablet Take 1 tablet (20 mEq total) by mouth daily. 03/28/15  Yes Mancel BaleElliott Wentz, MD  cephALEXin (KEFLEX) 500 MG capsule Take 1 capsule (500 mg total) by mouth 4 (four) times daily. Patient not taking: Reported on 06/15/2016 03/28/15   Mancel BaleElliott Wentz, MD  lidocaine (XYLOCAINE) 2 % solution Use as directed 20 mLs  in the mouth or throat as needed for mouth pain. 06/15/16   Marily MemosJason Laurence Crofford, MD    Family History History reviewed. No pertinent family history.  Social History Social History  Substance Use Topics  . Smoking status: Never Smoker  . Smokeless tobacco: Not on file  . Alcohol use No     Allergies   Beta adrenergic blockers; Codeine; and Other   Review of Systems Review of Systems  All other systems reviewed and are negative.    Physical Exam Updated Vital Signs BP 156/73 (BP Location: Right Arm)   Pulse 70   Temp 97.9 F (36.6 C) (Oral)   Resp 18   Ht 5\' 4"  (1.626 m)   Wt 145 lb (65.8 kg)   SpO2 100%   BMI 24.89 kg/m   Physical Exam  Constitutional: She appears well-developed and well-nourished.  HENT:  Head: Normocephalic and atraumatic.  Eyes: Conjunctivae and EOM are normal.  Neck: Normal range of motion.  Cardiovascular: Normal rate and regular rhythm.   Pulmonary/Chest: Effort normal. No stridor. No respiratory distress. She has no wheezes.  Abdominal: Soft. Bowel sounds are normal. She exhibits no distension.  Musculoskeletal: Normal range of motion. She exhibits no edema or deformity.  Neurological: She is alert.  Skin: Skin is warm and dry. No erythema. No pallor.  Nursing note and vitals reviewed.    ED Treatments / Results  Labs (all  labs ordered are listed, but only abnormal results are displayed) Labs Reviewed  Rosezena SensorI-STAT TROPOININ, ED  I-STAT CHEM 8, ED    EKG  EKG Interpretation  Date/Time:  Wednesday June 15 2016 12:31:32 EST Ventricular Rate:  61 PR Interval:    QRS Duration: 111 QT Interval:  454 QTC Calculation: 458 R Axis:   12 Text Interpretation:  Sinus rhythm Low voltage, extremity leads Confirmed by [facility] System[facility]ES[facility] MD, Barbara CowerJASON [phone]) on 06/15/2016 1:48:07 PM       Radiology Dg Chest 2 View  Result Date: 06/15/2016 CLINICAL DATA:  Sensation of food being stuck in her throat associated left-sided chest pain. The patient ingested 7  food and was forced to vomit for relief last night. EXAM: CHEST  2 VIEW COMPARISON:  PA and lateral chest x-ray of January 06, 2016 and CT scan of the abdomen and pelvis of July 27, 2011. FINDINGS: The lungs are adequately inflated and clear. The heart is mildly enlarged. The pulmonary vascularity is not engorged. There is a large hiatal hernia-partially intrathoracic stomach. There is calcification in the wall of the aortic arch. The mediastinum is normal in width. Saline breast implants are present bilaterally and exhibit capsular calcification. The bony structures exhibit no acute abnormalities. IMPRESSION: Large hiatal hernia-partially intrathoracic stomach. Thoracic aortic atherosclerosis. Mild cardiomegaly without pulmonary edema. Electronically Signed   By: David  SwazilandJordan M.D.   On: 06/15/2016 13:00    Procedures Procedures (including critical care time)  Medications Ordered in ED Medications  pantoprazole (PROTONIX) EC tablet 80 mg (80 mg Oral Given 06/15/16 1306)  gi cocktail (Maalox,Lidocaine,Donnatal) (30 mLs Oral Given 06/15/16 1306)     Initial Impression / Assessment and Plan / ED Course  I have reviewed the triage vital signs and the nursing notes.  Pertinent labs & imaging results that were available during my care of the patient were reviewed by me and considered in my medical decision making (see chart for details).  Clinical Course     Possibly stricture from long-standing reflux versus Barrett's esophagus likely needs a repeat EGD however does not need it emergently. No evidence of dehydration and she is tolerating fluids without difficulty in the emergency department. He is also able take her medications. Lateral follow-up with the bowel or gastroenterology or different gastroenterologist of her choice. We'll give her a prescription for viscous lidocaine to help with her symptoms.  Final Clinical Impressions(s) / ED Diagnoses   Final diagnoses:  Dysphagia, unspecified  type  Hiatal hernia    New Prescriptions New Prescriptions   LIDOCAINE (XYLOCAINE) 2 % SOLUTION    Use as directed 20 mLs in the mouth or throat as needed for mouth pain.     Marily MemosJason Marshell Rieger, MD 06/15/16 [phone]

## 2016-07-20 NOTE — ED Provider Notes (Signed)
Patient called and asked for me to call her back regarding her visit on 06/15/2016.  I called the patient back. She stated that she was doing okay with the swelling issue however she was now having constipation. She states she had decreased bowel movements for a couple weeks. She states that she's been taking Mylanta with her lidocaine. After reviewing her chart I confirmed with her that she did not have that problem when I saw her in the middle of December. She is not having pain with this and has not seen her primary doctor. I told her that the Mylanta could be contributing and at the first thing to do for constipation is to increase amount of fluids or drinking and take MiraLAX. However I could not give prescriptions or official recommendations over the phone for a new problem and not evaluating her for. I discussed that she should follow-up with her primary doctor to get this looked at further. Also discussed that if she wasn't passing ga,s or started having very severe abdominal pain she needed to return here for repeat evaluation. Patient appreciative of advice, plan to follow up with GI/PCP.   Marily MemosJason Pollyann Roa, MD 07/20/16 [phone]

## 2016-12-02 ENCOUNTER — Emergency Department (HOSPITAL_COMMUNITY): Payer: Medicare Other

## 2016-12-02 ENCOUNTER — Encounter (HOSPITAL_COMMUNITY): Payer: Self-pay | Admitting: Emergency Medicine

## 2016-12-02 ENCOUNTER — Emergency Department (HOSPITAL_COMMUNITY)
Admission: EM | Admit: 2016-12-02 | Discharge: 2016-12-02 | Disposition: A | Discharge status: Home / Self Care | Payer: Medicare Other | Attending: Emergency Medicine | Admitting: Emergency Medicine

## 2016-12-02 DIAGNOSIS — K449 Diaphragmatic hernia without obstruction or gangrene: Secondary | ICD-10-CM

## 2016-12-02 DIAGNOSIS — Z79899 Other long term (current) drug therapy: Secondary | ICD-10-CM | POA: Insufficient documentation

## 2016-12-02 DIAGNOSIS — I10 Essential (primary) hypertension: Secondary | ICD-10-CM | POA: Insufficient documentation

## 2016-12-02 DIAGNOSIS — R1013 Epigastric pain: Secondary | ICD-10-CM | POA: Diagnosis present

## 2016-12-02 DIAGNOSIS — R109 Unspecified abdominal pain: Secondary | ICD-10-CM

## 2016-12-02 LAB — CBC
HCT: 40.3 % (ref 36.0–46.0)
Hemoglobin: 13.3 g/dL (ref 12.0–15.0)
MCH: 28.2 pg (ref 26.0–34.0)
MCHC: 33 g/dL (ref 30.0–36.0)
MCV: 85.6 fL (ref 78.0–100.0)
Platelets: 250 10*3/uL (ref 150–400)
RBC: 4.71 MIL/uL (ref 3.87–5.11)
RDW: 13.7 % (ref 11.5–15.5)
WBC: 5.7 10*3/uL (ref 4.0–10.5)

## 2016-12-02 LAB — URINALYSIS, ROUTINE W REFLEX MICROSCOPIC
Bilirubin Urine: NEGATIVE
Glucose, UA: NEGATIVE mg/dL
Hgb urine dipstick: NEGATIVE
Ketones, ur: NEGATIVE mg/dL
Leukocytes, UA: NEGATIVE
Nitrite: NEGATIVE
Protein, ur: NEGATIVE mg/dL
Specific Gravity, Urine: 1.019 (ref 1.005–1.030)
pH: 6 (ref 5.0–8.0)

## 2016-12-02 LAB — COMPREHENSIVE METABOLIC PANEL
ALT: 13 U/L — ABNORMAL LOW (ref 14–54)
AST: 21 U/L (ref 15–41)
Albumin: 3.7 g/dL (ref 3.5–5.0)
Alkaline Phosphatase: 73 U/L (ref 38–126)
Anion gap: 9 (ref 5–15)
BUN: 10 mg/dL (ref 6–20)
CO2: 29 mmol/L (ref 22–32)
Calcium: 9.8 mg/dL (ref 8.9–10.3)
Chloride: 104 mmol/L (ref 101–111)
Creatinine, Ser: 0.83 mg/dL (ref 0.44–1.00)
GFR calc Af Amer: >60 mL/min (ref >60)
GFR calc non Af Amer: >60 mL/min (ref >60)
Glucose, Bld: 97 mg/dL (ref 65–99)
Potassium: 3.4 mmol/L — ABNORMAL LOW (ref 3.5–5.1)
Sodium: 142 mmol/L (ref 135–145)
Total Bilirubin: 0.8 mg/dL (ref 0.3–1.2)
Total Protein: 7.3 g/dL (ref 6.5–8.1)

## 2016-12-02 LAB — LIPASE, BLOOD: Lipase: 29 U/L (ref 11–51)

## 2016-12-02 MED ORDER — IOPAMIDOL (ISOVUE-300) INJECTION 61%
INTRAVENOUS | Status: AC
  Administered 2016-12-02: 80 mL
  Filled 2016-12-02: qty 100

## 2016-12-02 NOTE — ED Notes (Signed)
Pt stated that she called the GI doctor that she was referred to back in December of 2017 and also called several other doctors trying to get in, but since there was a waiting list and a long time before an appointment, patient gave up and did not ever make an appt.

## 2016-12-02 NOTE — Discharge Instructions (Signed)
Please read attached information. If you experience any new or worsening signs or symptoms please return to the emergency room for evaluation. Please follow-up with your primary care provider or specialist as discussed.  °

## 2016-12-02 NOTE — ED Notes (Signed)
On way to CT 

## 2016-12-02 NOTE — ED Notes (Signed)
Two unsuccessful IV attempts.

## 2016-12-02 NOTE — ED Provider Notes (Signed)
Medical screening examination/treatment/procedure(s) were conducted as a shared visit with non-physician practitioner(s) and myself.  I personally evaluated the patient during the encounter. Briefly, the patient is a 71 y.o. female with a history of GERD, hiatal hernia who presents to the ED for epigastric discomfort that has since resolved. Some discomfort with abdominal exam. Labs grossly reassuring. CT scan revealed worsening of hiatal hernia otherwise no serious intra-abdominal inflammatory/infectious process. Patient able to tolerate by mouth in the ED. Safe for discharge with strict return precautions. Recommended keeping a journal of her diet and associated symptoms to try to identify any correlation.    EKG Interpretation None           Cardama, Amadeo GarnetPedro Eduardo, MD 12/02/16 1539

## 2016-12-02 NOTE — ED Provider Notes (Signed)
[facility] Provider Note   CSN: [id number] Arrival date & time: 12/02/16  1105   History   Chief Complaint Chief Complaint  Patient presents with  . Abdominal Pain    HPI Eileen Phillipsleanor D Nguyen is a 71 y.o. female.  HPI   71 year old female presents today with complaints of abdominal pain. Patient was most recently seen here in December 2017. She notes that visit was for dysphasia and that she been having for a prolonged period of time. She notes medication given here improved her symptoms. She notes that later on that day she started to have constipation, called the provider she had seen here who instructed her to take MiraLAX. She was told that she not take this by the pharmacist. Patient and she attempted to call gastroenterologist in December but they would not see her for 1 month. She was frustrated and did not ordered the follow-up and has not seen one since. When asked if she has a Education officer, environmentalGastro neurologist patient denied seeing a gastroenterologist. Chart review shows she seen gastroenterology in the past, she notes a colonoscopy in the past that was done by someone other than a gastroenterologist. Patient notes that since December she's had very narrow caliber of stools and feels that she needs "a colonoscopy". She notes today she was having some epigastric discomfort that went away, notes pain with palpation of her abdomen presently. Patient notes that she has not lost a significant amount of weight, denies any fevers, nausea or vomiting, blood in her stools. Patient denies a history of hiatal hernia prior to her visit and December, after informing her that she is seen gastroenterology for hiatal hernia in the past she notes that "I don't want to talk about that I don't want to see the doctors name".   Past Medical History:  Diagnosis Date  . Acid reflux   . Degenerative disc disease, lumbar   . Hypertension   . Psoriasis     There are no active problems to display for this  patient.   History reviewed. No pertinent surgical history.  OB History    No data available     Home Medications    Prior to Admission medications   Medication Sig Start Date End Date Taking? Authorizing Provider  chlorthalidone (HYGROTON) 25 MG tablet Take 25 mg by mouth daily.   Yes [provider]  cholecalciferol (VITAMIN D) 1000 units tablet Take 1,000 Units by mouth daily.   Yes [provider]  lidocaine (XYLOCAINE) 2 % solution Use as directed 20 mLs in the mouth or throat as needed for mouth pain. 06/15/16  Yes Mesner, Barbara CowerJason, MD  omeprazole (PRILOSEC) 40 MG capsule Take 40 mg by mouth 2 (two) times daily.   Yes [provider]  potassium chloride SA (K-DUR,KLOR-CON) 20 MEQ tablet Take 1 tablet (20 mEq total) by mouth daily. 03/28/15  Yes Mancel BaleWentz, Elliott, MD  cephALEXin (KEFLEX) 500 MG capsule Take 1 capsule (500 mg total) by mouth 4 (four) times daily. Patient not taking: Reported on 06/15/2016 03/28/15   Mancel BaleWentz, Elliott, MD    Family History No family history on file.  Social History Social History  Substance Use Topics  . Smoking status: Never Smoker  . Smokeless tobacco: Not on file  . Alcohol use No     Allergies   Beta adrenergic blockers; Codeine; and Other   Review of Systems Review of Systems  All other systems reviewed and are negative.  Physical Exam Updated Vital Signs BP 138/72  Pulse 64   Temp 97.7 F (36.5 C) (Oral)   Resp 17   Ht 5\' 2"  (1.575 m)   SpO2 100%   Physical Exam  Constitutional: She is oriented to person, place, and time. She appears well-developed and well-nourished.  HENT:  Head: Normocephalic and atraumatic.  Eyes: Conjunctivae are normal. Pupils are equal, round, and reactive to light. Right eye exhibits no discharge. Left eye exhibits no discharge. No scleral icterus.  Neck: Normal range of motion. No JVD present. No tracheal deviation present.  Pulmonary/Chest: Effort normal. No stridor.   Abdominal: Soft. She exhibits no distension.  TTP of mid abd, no rebounding or guarding   Neurological: She is alert and oriented to person, place, and time. Coordination normal.  Psychiatric: She has a normal mood and affect. Her behavior is normal. Judgment and thought content normal.  Nursing note and vitals reviewed.    ED Treatments / Results  Labs (all labs ordered are listed, but only abnormal results are displayed) Labs Reviewed  COMPREHENSIVE METABOLIC PANEL - Abnormal; Notable for the following:       Result Value   Potassium 3.4 (*)    ALT 13 (*)    All other components within normal limits  LIPASE, BLOOD  CBC  URINALYSIS, ROUTINE W REFLEX MICROSCOPIC    EKG  EKG Interpretation None       Radiology Ct Abdomen Pelvis W Contrast  Result Date: 12/02/2016 CLINICAL DATA:  Central abdominal pain, intermittent over the last 5 months. Worsening today. EXAM: CT ABDOMEN AND PELVIS WITH CONTRAST TECHNIQUE: Multidetector CT imaging of the abdomen and pelvis was performed using the standard protocol following bolus administration of intravenous contrast. CONTRAST:  80mL ISOVUE-300 IOPAMIDOL (ISOVUE-300) INJECTION 61% COMPARISON:  07/27/2011 FINDINGS: Lower chest: Bilateral breast implants. Very large hiatal hernia, more extensive than on the previous study. No pleural or pericardial fluid. Hepatobiliary: Scattered benign appearing liver cysts as seen previously. No significant liver parenchymal finding. Previous cholecystectomy. Pancreas: Normal Spleen: Normal Adrenals/Urinary Tract: Adrenal glands are normal. 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 Few scattered simple renal cysts. No evidence of obstruction/ hydronephrosis. There is a bladder diverticulum projecting towards the left. Stomach/Bowel: Within normal limits. Vascular/Lymphatic: Aortic atherosclerosis. No aneurysm. IVC is normal. No retroperitoneal adenopathy. Reproductive: Negative.  No pelvic mass. Other: No free fluid or  air. Musculoskeletal: Extensive chronic lower thoracic and lumbar degenerative changes. IMPRESSION: Enlargement of the hiatal hernia in the lower chest. Benign appearing liver cysts and renal cysts. 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 Extensive spinal degenerative changes. Electronically Signed   By: Paulina Fusi M.D.   On: 12/02/2016 14:56    Procedures Procedures (including critical care time)  Medications Ordered in ED Medications  iopamidol (ISOVUE-300) 61 % injection (80 mLs  Contrast Given 12/02/16 1325)     Initial Impression / Assessment and Plan / ED Course  I have reviewed the triage vital signs and the nursing notes.  Pertinent labs & imaging results that were available during my care of the patient were reviewed by me and considered in my medical decision making (see chart for details).      Final Clinical Impressions(s) / ED Diagnoses   Final diagnoses:  Abdominal pain  Abdominal pain, unspecified abdominal location  Hiatal hernia   Labs: Urinalysis, lipase, CMP and CBC  Imaging: CT abdomen pelvis with contrast  Consults:  Therapeutics:  Discharge Meds:   Assessment/Plan: 71 year old female presents today with with numerous  complaints. Patient with chronic abdominal complaints including decreased stool caliber. Patient initially reporting no history of hiatal hernia total recent hospital visit, chart review shows she has had hiatal hernia in the past and has seen gastroenterology. Patient reports abdominal pain this morning, tenderness upon my evaluation initially. CT scan showed no significant findings within the abdomen to explain her pain. Repeat assessment shows no significant abdominal pain. Patient continues to have hiatal hernia in the lower chest, she has tolerating both solids and liquids, no significant vomiting or pain with eating. No need for further evaluation or management here in the ED  setting. Patient will be referred to gastroenterology, she is instructed to follow-up with them, return to emergency room if she felt any new or worsening signs or symptoms. Remainder of her evaluation reassuring. Patient given return precautions, she verbalized understanding and agreement to today's plan had no further questions or concerns      New Prescriptions New Prescriptions   No medications on file     Eyvonne Mechanic, Cordelia Poche 12/02/16 1552

## 2016-12-02 NOTE — ED Triage Notes (Signed)
Pt reports she was diagnosed with hiatal hernia here, states when she tried to follow up with the GI doctor they told her it would be 1 month before she could be seen and she thought that was too long. Reports abd pain is ongoing along with constipation.

## 2017-08-09 ENCOUNTER — Encounter (HOSPITAL_COMMUNITY): Payer: Self-pay | Admitting: Emergency Medicine

## 2017-08-09 ENCOUNTER — Emergency Department (HOSPITAL_COMMUNITY)
Admission: EM | Admit: 2017-08-09 | Discharge: 2017-08-09 | Disposition: A | Discharge status: Home / Self Care | Payer: Medicare Other | Attending: Emergency Medicine | Admitting: Emergency Medicine

## 2017-08-09 ENCOUNTER — Emergency Department (HOSPITAL_COMMUNITY): Payer: Medicare Other

## 2017-08-09 DIAGNOSIS — R0600 Dyspnea, unspecified: Secondary | ICD-10-CM | POA: Insufficient documentation

## 2017-08-09 DIAGNOSIS — K219 Gastro-esophageal reflux disease without esophagitis: Secondary | ICD-10-CM | POA: Diagnosis not present

## 2017-08-09 DIAGNOSIS — Z79899 Other long term (current) drug therapy: Secondary | ICD-10-CM | POA: Diagnosis not present

## 2017-08-09 DIAGNOSIS — I10 Essential (primary) hypertension: Secondary | ICD-10-CM | POA: Diagnosis not present

## 2017-08-09 DIAGNOSIS — R079 Chest pain, unspecified: Secondary | ICD-10-CM | POA: Diagnosis present

## 2017-08-09 DIAGNOSIS — R42 Dizziness and giddiness: Secondary | ICD-10-CM | POA: Insufficient documentation

## 2017-08-09 HISTORY — DX: Gastro-esophageal reflux disease without esophagitis: K21.9

## 2017-08-09 HISTORY — DX: Diaphragmatic hernia without obstruction or gangrene: K44.9

## 2017-08-09 LAB — CBC
HCT: 39 % (ref 36.0–46.0)
Hemoglobin: 12.7 g/dL (ref 12.0–15.0)
MCH: 28.3 pg (ref 26.0–34.0)
MCHC: 32.6 g/dL (ref 30.0–36.0)
MCV: 86.9 fL (ref 78.0–100.0)
Platelets: 231 10*3/uL (ref 150–400)
RBC: 4.49 MIL/uL (ref 3.87–5.11)
RDW: 13.2 % (ref 11.5–15.5)
WBC: 5.6 10*3/uL (ref 4.0–10.5)

## 2017-08-09 LAB — COMPREHENSIVE METABOLIC PANEL
ALT: 14 U/L (ref 14–54)
AST: 34 U/L (ref 15–41)
Albumin: 3.3 g/dL — ABNORMAL LOW (ref 3.5–5.0)
Alkaline Phosphatase: 71 U/L (ref 38–126)
Anion gap: 12 (ref 5–15)
BUN: 16 mg/dL (ref 6–20)
CO2: 28 mmol/L (ref 22–32)
Calcium: 10.1 mg/dL (ref 8.9–10.3)
Chloride: 102 mmol/L (ref 101–111)
Creatinine, Ser: 0.78 mg/dL (ref 0.44–1.00)
GFR calc Af Amer: >60 mL/min (ref >60)
GFR calc non Af Amer: >60 mL/min (ref >60)
Glucose, Bld: 88 mg/dL (ref 65–99)
Potassium: 3.9 mmol/L (ref 3.5–5.1)
Sodium: 142 mmol/L (ref 135–145)
Total Bilirubin: 1.2 mg/dL (ref 0.3–1.2)
Total Protein: 6.5 g/dL (ref 6.5–8.1)

## 2017-08-09 LAB — I-STAT TROPONIN, ED: Troponin i, poc: 0 ng/mL (ref 0.00–0.08)

## 2017-08-09 LAB — LIPASE, BLOOD: Lipase: 43 U/L (ref 11–51)

## 2017-08-09 LAB — BRAIN NATRIURETIC PEPTIDE: B Natriuretic Peptide: 37.5 pg/mL (ref 0.0–100.0)

## 2017-08-09 LAB — D-DIMER, QUANTITATIVE (NOT AT ARMC): D-Dimer, Quant: 0.64 ug/mL-FEU — ABNORMAL HIGH (ref 0.00–0.50)

## 2017-08-09 MED ORDER — OMEPRAZOLE 20 MG PO CPDR: 20.0000 mg | DELAYED_RELEASE_CAPSULE | Freq: Two times a day (BID) | ORAL | 0 refills | Status: AC

## 2017-08-09 MED ORDER — LIDOCAINE VISCOUS 2 % MT SOLN: 15.0000 mL | OROMUCOSAL | 0 refills | Status: DC | PRN

## 2017-08-09 MED ORDER — GI COCKTAIL ~~LOC~~
30.0000 mL | Freq: Once | ORAL | Status: AC
  Administered 2017-08-09: 30 mL via ORAL
  Filled 2017-08-09: qty 30

## 2017-08-09 NOTE — ED Triage Notes (Addendum)
Per [facility]: Pt to ED from home c/o substernal, non-radiating CP onset last night, went away on its own. Patient states she also had dizziness at the time. Pt reports this morning at 4am, she was cleaning the litter box and had the same dizziness, no LOC. Patient reports the CP returned this afternoon with dizziness again. Given 324 ASA PTA, no pain at this time. Also c/o nausea en route - given 4mg  zofran. Hx GERD, hiatal hernia. Denies cardiac hx, denies SOB, dizziness or lightheadedness at this time. 20g. LFA. EMS VS: HR 77 sinus rhythm with PVCs, 126/95, RR 16, 99% RA, CBG 114. A&O x 4. Resp e/u, skin warm/dry.

## 2017-08-09 NOTE — ED Notes (Signed)
Patient adds that she has been under a lot of stress lately, especially last night. She states that she thinks the pain could be related to her esophagus and was told in the past it may need to be stretched in the future. Reports she feels full but hasn't eaten anything. EDP at bedside.

## 2017-08-09 NOTE — Discharge Instructions (Signed)
Take 1 tablet of omeprazole 2 times per day.  You can swallow 15 mL of viscous lidocaine every 3 hours as needed for heartburn.  Please do not use this medication more than once every 3 hours due to side effects.  The other medication that was in the GI cocktail that you were given today was Maalox, which is available over-the-counter.  Please call up our gastroenterology, their information is provided above, to schedule a follow-up appointment from your visit today.  If you develop new or worsening symptoms, including constant, worsening shortness of breath, chest pain that does not resolve, numbness, weakness, vomiting or other new concerning symptoms, please return to the emergency department for re-evaluation.

## 2017-08-09 NOTE — ED Provider Notes (Signed)
[facility] EMERGENCY DEPARTMENT Provider Note   CSN: [id number] Arrival date & time: 08/09/17  1535     History   Chief Complaint Chief Complaint  Patient presents with  . Chest Pain    HPI Eileen Nguyen is a 72 y.o. female with a h/o of HTN, hiatal hernia with GERD, and DDD who presents to the emergency department with a chief complaint of chest pain with associated dyspnea and xerostomia.   She reports recurrent episodes of chest pain that has been for at least 1 month. She characterizes the pain as "choking". The pain is constant and located in the substernal area with infrequent radiation to or under the left breast as the same choking pain.  She reports that the xerostomia and dyspnea resolved when the chest pain also spontaneously resolves.  She reports most episodes will last about 15 minutes, but some will last for longer.  No aggravating or alleviating factors including taking deep breaths, exertion, or positional changes.  She states that after the pain begins that she will frequently have to sit down because she feels tired.  Sometimes this will resolve the episode, but other times it will not.   She reports that she had an episode of chest pain last night but was still present when she went to bed and was there when she awoke this morning.  The episode did not resolve so the neighbor took her to the [facility] to have her blood pressure checked about an hour and a half before arrival with a reading of 180/34 so the neighbor called 911.  She also reports she has been having "dizzy spells", which she describes as feeling foggy headed, for "a good while.  She reports the dizziness is constant after onset and will persist throughout most of the day before resolving spontaneously and "then come right back". Last episode occurred at 4 AM.   She also reports swelling and pain to the left leg that is worsened over the last 3 weeks. She states that she tore some  ligaments from a previous injury to the left ankle and has always had a minimal swelling, but nothing like the last 3 weeks. She denies left knee or hip or right leg pain or swelling.   No fevers, chills, palpitations, back pain, rash, visual changes, headache, tinnitus, wheezing, cough, N/V/D, or abdominal pain.   She was started on omeprazole approximately 1 year ago for heartburn when she was diagnosed with a hiatal hernia, but reports that she never had heartburn even before she started on the medication. No recent medication changes.  She has also been under a significant amount of stress over the month after her husband of 45+ years left her suddenly, which she doesn't want to talk about and her family and friends may not know. She wants to keep the information private.   Negative stress test at [facility] on 01/06/2016.  TTE on 04/10/14 with EF of 55-60% Diastolicleft ventriculardysfunction  Elevatedleft ventricularfilling pressures  Mitralregurgitation (trivial-mild)  Dilatedleft atrium  Aorticsclerosis  Aorticregurgitation (mild-moderate)  Pulmonaryhypertension(mild - see detailbelow)  Normalright ventricularcontractile performance  Tricuspidregurgitation(mild)   She is not followed by cardiology. Family of CVD includes her father.   The history is provided by the patient. No language interpreter was used.    Past Medical History:  Diagnosis Date  . Acid reflux   . Degenerative disc disease, lumbar   . Hiatal hernia with GERD   . Hypertension   . Psoriasis  There are no active problems to display for this patient.   History reviewed. No pertinent surgical history.  OB History    No data available       Home Medications    Prior to Admission medications   Medication Sig Start Date End Date Taking? Authorizing Provider  cephALEXin (KEFLEX) 500 MG capsule Take 1 capsule (500 mg total) by mouth  4 (four) times daily. Patient not taking: Reported on 06/15/2016 03/28/15   Mancel Bale, MD  chlorthalidone (HYGROTON) 25 MG tablet Take 25 mg by mouth daily.    [provider]  cholecalciferol (VITAMIN D) 1000 units tablet Take 1,000 Units by mouth daily.    [provider]  lidocaine (XYLOCAINE) 2 % solution Use as directed 15 mLs in the mouth or throat every 3 (three) hours as needed for mouth pain. 08/09/17   Kristy Schomburg A, PA-C  omeprazole (PRILOSEC) 20 MG capsule Take 1 capsule (20 mg total) by mouth 2 (two) times daily. 08/09/17 09/08/17  Daegan Arizmendi A, PA-C  potassium chloride SA (K-DUR,KLOR-CON) 20 MEQ tablet Take 1 tablet (20 mEq total) by mouth daily. 03/28/15   Mancel Bale, MD    Family History No family history on file.  Social History Social History   Tobacco Use  . Smoking status: Never Smoker  Substance Use Topics  . Alcohol use: No  . Drug use: No     Allergies   Beta adrenergic blockers; Codeine; and Other   Review of Systems Review of Systems  Constitutional: Negative for activity change, chills and fever.  Respiratory: Positive for shortness of breath. Negative for cough and wheezing.   Cardiovascular: Positive for chest pain and leg swelling. Negative for palpitations.  Gastrointestinal: Negative for abdominal pain, diarrhea, nausea and vomiting.  Genitourinary: Negative for dysuria.  Musculoskeletal: Negative for back pain.  Skin: Negative for rash.  Allergic/Immunologic: Negative for immunocompromised state.  Neurological: Negative for headaches.  Psychiatric/Behavioral: Negative for confusion.   Physical Exam Updated Vital Signs BP 125/88   Pulse 64   Resp (!) 22   SpO2 98%   Physical Exam  Constitutional: She is oriented to person, place, and time. She appears well-developed and well-nourished. No distress.  HENT:  Head: Normocephalic and atraumatic.  Eyes: Conjunctivae are normal. No scleral icterus.  Neck: Normal  range of motion. Neck supple. JVD present.  Cardiovascular: Normal rate, regular rhythm, normal heart sounds and intact distal pulses. Exam reveals no gallop and no friction rub.  No murmur heard. Pulmonary/Chest: Effort normal and breath sounds normal. No stridor. No respiratory distress. She has no wheezes. She has no rales. She exhibits no tenderness.  Abdominal: Soft. Bowel sounds are normal. She exhibits no distension and no mass. There is no tenderness. There is no rebound and no guarding. No hernia.  Musculoskeletal: Normal range of motion.  Minimal edema and tenderness noted to the left calf and thigh.  Right leg is nontender.  No warmth or ecchymosis noted.  Neurological: She is alert and oriented to person, place, and time.  Skin: Skin is warm. Capillary refill takes less than 2 seconds. No rash noted.  Psychiatric: Her behavior is normal.  Nursing note and vitals reviewed.  ED Treatments / Results  Labs (all labs ordered are listed, but only abnormal results are displayed) Labs Reviewed  COMPREHENSIVE METABOLIC PANEL - Abnormal; Notable for the following components:      Result Value   Albumin 3.3 (*)    All other components within  normal limits  D-DIMER, QUANTITATIVE (NOT AT [facility]) - Abnormal; Notable for the following components:   D-Dimer, Quant 0.64 (*)    All other components within normal limits  CBC  LIPASE, BLOOD  BRAIN NATRIURETIC PEPTIDE  I-STAT TROPONIN, ED    EKG  EKG Interpretation  Date/Time:  Wednesday August 09 2017 15:58:52 EST Ventricular Rate:  76 PR Interval:    QRS Duration: 116 QT Interval:  412 QTC Calculation: 464 R Axis:   63 Text Interpretation:  Sinus rhythm Short PR interval Nonspecific intraventricular conduction delay Borderline repolarization abnormality No significant change since last tracing Confirmed by Jacalyn LefevreHaviland, Julie [phone]([id number]) on 08/09/2017 4:04:07 PM       Radiology Dg Chest 2 View  Result Date: 08/09/2017 CLINICAL DATA:   Substernal chest pain since last evening. EXAM: CHEST  2 VIEW COMPARISON:  06/15/2016 FINDINGS: The cardiac silhouette, mediastinal and hilar contours are within normal limits and stable. The lungs are clear. No pleural effusion. The bony thorax is intact. IMPRESSION: No acute cardiopulmonary findings. Electronically Signed   By: Rudie MeyerP.  Gallerani M.D.   On: 08/09/2017 17:00    Procedures Procedures (including critical care time)  Medications Ordered in ED Medications  gi cocktail (Maalox,Lidocaine,Donnatal) (30 mLs Oral Given 08/09/17 1750)     Initial Impression / Assessment and Plan / ED Course  I have reviewed the triage vital signs and the nursing notes.  Pertinent labs & imaging results that were available during my care of the patient were reviewed by me and considered in my medical decision making (see chart for details).     72 year old female with a h/o of HTN, hiatal hernia with GERD, and DDD presenting with recurrent chest pain with associated dyspnea and xerostomia as well as recurrent episodes of dizziness.  CBC and CMP are unremarkable.  BNP 37.5.  D-dimer 0.64, which is negative when adjusted for age.  Chest x-ray with no active cardiopulmonary disease.  EKG without acute changes.  Troponin is negative.  She was actively having chest pain in the emergency department, which resolved after a GI cocktail   On reexamination.  She later informed Dr. Particia NearingHaviland that the chest pain went also radiate up her esophagus into her throat. HEART score 3.  The patient was seen and evaluated with Dr. Ulyses SouthwardHelbling, attending physician.  Suspect gastroesophageal reflux.  Doubt MI, PE, pneumonia, pericarditis, myocarditis, pneumothorax, or cardiac tamponade. will discharge the patient home with viscous lidocaine, omeprazole, and suggested Maalox over-the-counter.  Will provide her with a referral to GI as she has not been seen in several years and she has a history of a hiatal hernia.  Strict return precautions  given.  She has no acute distress.  The patient is safe for discharge at this time.  Final Clinical Impressions(s) / ED Diagnoses   Final diagnoses:  Gastroesophageal reflux disease, esophagitis presence not specified    ED Discharge Orders        Ordered    lidocaine (XYLOCAINE) 2 % solution  Every  3 hours PRN     08/09/17 1931    omeprazole (PRILOSEC) 20 MG capsule  2 times daily     08/09/17 1931       Azya Barbero, Coral ElseMia A, PA-C 08/10/17 Henreitta [id number]    Haviland, Julie, MD 08/13/17 1505

## 2017-12-28 ENCOUNTER — Encounter (HOSPITAL_COMMUNITY): Payer: Self-pay | Admitting: Emergency Medicine

## 2017-12-28 ENCOUNTER — Emergency Department (HOSPITAL_COMMUNITY): Payer: 59

## 2017-12-28 ENCOUNTER — Other Ambulatory Visit: Payer: Self-pay

## 2017-12-28 ENCOUNTER — Emergency Department (HOSPITAL_COMMUNITY)
Admission: EM | Admit: 2017-12-28 | Discharge: 2017-12-29 | Disposition: A | Discharge status: Home / Self Care | Payer: 59 | Attending: Emergency Medicine | Admitting: Emergency Medicine

## 2017-12-28 DIAGNOSIS — E860 Dehydration: Secondary | ICD-10-CM | POA: Diagnosis not present

## 2017-12-28 DIAGNOSIS — R42 Dizziness and giddiness: Secondary | ICD-10-CM | POA: Diagnosis present

## 2017-12-28 DIAGNOSIS — R110 Nausea: Secondary | ICD-10-CM

## 2017-12-28 LAB — LIPASE, BLOOD: Lipase: 32 U/L (ref 11–51)

## 2017-12-28 LAB — CBC
HCT: 38.1 % (ref 36.0–46.0)
Hemoglobin: 12.2 g/dL (ref 12.0–15.0)
MCH: 28.5 pg (ref 26.0–34.0)
MCHC: 32 g/dL (ref 30.0–36.0)
MCV: 89 fL (ref 78.0–100.0)
Platelets: 211 10*3/uL (ref 150–400)
RBC: 4.28 MIL/uL (ref 3.87–5.11)
RDW: 12.7 % (ref 11.5–15.5)
WBC: 5.2 10*3/uL (ref 4.0–10.5)

## 2017-12-28 LAB — COMPREHENSIVE METABOLIC PANEL
ALT: 13 U/L (ref 0–44)
AST: 20 U/L (ref 15–41)
Albumin: 3.7 g/dL (ref 3.5–5.0)
Alkaline Phosphatase: 60 U/L (ref 38–126)
Anion gap: 10 (ref 5–15)
BUN: 9 mg/dL (ref 8–23)
CO2: 29 mmol/L (ref 22–32)
Calcium: 9.7 mg/dL (ref 8.9–10.3)
Chloride: 102 mmol/L (ref 98–111)
Creatinine, Ser: 0.77 mg/dL (ref 0.44–1.00)
GFR calc Af Amer: >60 mL/min (ref >60)
GFR calc non Af Amer: >60 mL/min (ref >60)
Glucose, Bld: 82 mg/dL (ref 70–99)
Potassium: 3 mmol/L — ABNORMAL LOW (ref 3.5–5.1)
Sodium: 141 mmol/L (ref 135–145)
Total Bilirubin: 0.8 mg/dL (ref 0.3–1.2)
Total Protein: 6.6 g/dL (ref 6.5–8.1)

## 2017-12-28 MED ORDER — SODIUM CHLORIDE 0.9 % IV BOLUS
2000.0000 mL | Freq: Once | INTRAVENOUS | Status: AC
  Administered 2017-12-29: 2000 mL via INTRAVENOUS

## 2017-12-28 MED ORDER — ONDANSETRON HCL 4 MG/2ML IJ SOLN
4.0000 mg | Freq: Once | INTRAMUSCULAR | Status: AC
  Administered 2017-12-29: 4 mg via INTRAVENOUS
  Filled 2017-12-28: qty 2

## 2017-12-28 MED ORDER — ONDANSETRON 4 MG PO TBDP: 4.0000 mg | ORAL_TABLET | Freq: Once | ORAL | Status: DC | PRN

## 2017-12-28 NOTE — ED Triage Notes (Signed)
Per [facility]:  Patient presents to ED after drinking her colonoscopy prep medications yesterday in anticipation for the procedure today.  Patient states she was unable to find transportation, so she called to cancel.  Now experiencing increasing dizziness (patient with a hx of dizziness, but more constant and severe today with position changes).  Patient also c/o nausea, denies vomiting.  Denies pain.  Pt states she has been hydrating as normal today.  Denies fever of chills.  Pt also has a hx of hiatal hernia, and states her throat feels tight "like I'm choking" since 5pm yesterday.  VSS en route, CBG 92

## 2017-12-28 NOTE — ED Notes (Signed)
ED Provider at bedside. 

## 2017-12-28 NOTE — ED Provider Notes (Signed)
Patient placed in Quick Look pathway, seen and evaluated   Chief Complaint: dizzy and nausea  HPI:   Eileen Nguyen is a 72 y.o. female who present to the ED with dizziness and nausea. Per [facility]:  Patient presents to ED after drinking her colonoscopy prep medications yesterday in anticipation for the procedure today.  Patient states she was unable to find transportation, so she called to cancel.  Now experiencing increasing dizziness (patient with a hx of dizziness, but more constant and severe today with position changes).  Patient also c/o nausea, denies vomiting.  Denies pain.  Pt states she has been hydrating as normal today.  Denies fever of chills.  Pt also has a hx of hiatal hernia, and states her throat feels tight "like I'm choking" since 5pm yesterday.  VSS en route, CBG 92  ROS: Neuro: dizziness  GI: nausea  Physical Exam:  BP 117/65   Pulse 61   Temp 98.1 F (36.7 C) (Oral)   Resp 14   SpO2 98%    Gen: No distress  Neuro: Awake and Alert  Skin: Warm and dry        Initiation of care has begun. The patient has been counseled on the process, plan, and necessity for staying for the completion/evaluation, and the remainder of the medical screening examination    Janne Napoleoneese, Doll Frazee M, NP 12/29/17 1134    Rolland PorterJames, Mark, MD 01/08/18 1259

## 2017-12-29 DIAGNOSIS — E860 Dehydration: Secondary | ICD-10-CM | POA: Diagnosis not present

## 2017-12-29 MED ORDER — ONDANSETRON 8 MG PO TBDP: 8.0000 mg | ORAL_TABLET | Freq: Three times a day (TID) | ORAL | 0 refills | Status: DC | PRN

## 2017-12-29 NOTE — ED Notes (Signed)
Patient Alert and oriented to baseline. Stable and ambulatory to baseline. Patient verbalized understanding of the discharge instructions.  Patient belongings were taken by the patient.   

## 2018-03-26 DIAGNOSIS — K219 Gastro-esophageal reflux disease without esophagitis: Secondary | ICD-10-CM | POA: Insufficient documentation

## 2018-05-15 ENCOUNTER — Other Ambulatory Visit: Payer: Self-pay

## 2018-05-15 ENCOUNTER — Encounter (HOSPITAL_COMMUNITY): Payer: Self-pay | Admitting: Emergency Medicine

## 2018-05-15 ENCOUNTER — Emergency Department (HOSPITAL_COMMUNITY): Payer: 59

## 2018-05-15 ENCOUNTER — Emergency Department (HOSPITAL_COMMUNITY)
Admission: EM | Admit: 2018-05-15 | Discharge: 2018-05-16 | Disposition: A | Discharge status: Home / Self Care | Payer: 59 | Attending: Emergency Medicine | Admitting: Emergency Medicine

## 2018-05-15 DIAGNOSIS — R0789 Other chest pain: Secondary | ICD-10-CM | POA: Insufficient documentation

## 2018-05-15 DIAGNOSIS — E876 Hypokalemia: Secondary | ICD-10-CM | POA: Insufficient documentation

## 2018-05-15 DIAGNOSIS — I10 Essential (primary) hypertension: Secondary | ICD-10-CM | POA: Insufficient documentation

## 2018-05-15 DIAGNOSIS — Z79899 Other long term (current) drug therapy: Secondary | ICD-10-CM | POA: Diagnosis not present

## 2018-05-15 LAB — CBC WITH DIFFERENTIAL/PLATELET
Abs Immature Granulocytes: 0.02 10*3/uL (ref 0.00–0.07)
Basophils Absolute: 0 10*3/uL (ref 0.0–0.1)
Basophils Relative: 0 %
Eosinophils Absolute: 0.2 10*3/uL (ref 0.0–0.5)
Eosinophils Relative: 5 %
HCT: 41.9 % (ref 36.0–46.0)
Hemoglobin: 13.1 g/dL (ref 12.0–15.0)
Immature Granulocytes: 0 %
Lymphocytes Relative: 35 %
Lymphs Abs: 1.9 10*3/uL (ref 0.7–4.0)
MCH: 27.2 pg (ref 26.0–34.0)
MCHC: 31.3 g/dL (ref 30.0–36.0)
MCV: 86.9 fL (ref 80.0–100.0)
Monocytes Absolute: 0.6 10*3/uL (ref 0.1–1.0)
Monocytes Relative: 11 %
Neutro Abs: 2.6 10*3/uL (ref 1.7–7.7)
Neutrophils Relative %: 49 %
Platelets: 235 10*3/uL (ref 150–400)
RBC: 4.82 MIL/uL (ref 3.87–5.11)
RDW: 12.9 % (ref 11.5–15.5)
WBC: 5.3 10*3/uL (ref 4.0–10.5)
nRBC: 0 % (ref 0.0–0.2)

## 2018-05-15 LAB — I-STAT TROPONIN, ED
Troponin i, poc: 0 ng/mL (ref 0.00–0.08)
Troponin i, poc: 0 ng/mL (ref 0.00–0.08)

## 2018-05-15 LAB — BASIC METABOLIC PANEL
Anion gap: 8 (ref 5–15)
BUN: 11 mg/dL (ref 8–23)
CO2: 27 mmol/L (ref 22–32)
Calcium: 9.5 mg/dL (ref 8.9–10.3)
Chloride: 102 mmol/L (ref 98–111)
Creatinine, Ser: 0.87 mg/dL (ref 0.44–1.00)
GFR calc Af Amer: >60 mL/min (ref >60)
GFR calc non Af Amer: >60 mL/min (ref >60)
Glucose, Bld: 98 mg/dL (ref 70–99)
Potassium: 2.5 mmol/L — CL (ref 3.5–5.1)
Sodium: 137 mmol/L (ref 135–145)

## 2018-05-15 LAB — I-STAT CHEM 8, ED
BUN: 12 mg/dL (ref 8–23)
Calcium, Ion: 1.16 mmol/L (ref 1.15–1.40)
Chloride: 99 mmol/L (ref 98–111)
Creatinine, Ser: 0.8 mg/dL (ref 0.44–1.00)
Glucose, Bld: 125 mg/dL — ABNORMAL HIGH (ref 70–99)
HCT: 41 % (ref 36.0–46.0)
Hemoglobin: 13.9 g/dL (ref 12.0–15.0)
Potassium: 2.6 mmol/L — CL (ref 3.5–5.1)
Sodium: 139 mmol/L (ref 135–145)
TCO2: 29 mmol/L (ref 22–32)

## 2018-05-15 LAB — D-DIMER, QUANTITATIVE: D-Dimer, Quant: 0.43 ug/mL-FEU (ref 0.00–0.50)

## 2018-05-15 LAB — MAGNESIUM: Magnesium: 2.1 mg/dL (ref 1.7–2.4)

## 2018-05-15 MED ORDER — POTASSIUM CHLORIDE CRYS ER 20 MEQ PO TBCR
60.0000 meq | EXTENDED_RELEASE_TABLET | Freq: Once | ORAL | Status: AC
  Administered 2018-05-15: 60 meq via ORAL
  Filled 2018-05-15: qty 3

## 2018-05-15 MED ORDER — POTASSIUM CHLORIDE 10 MEQ/100ML IV SOLN
10.0000 meq | Freq: Once | INTRAVENOUS | Status: AC
  Administered 2018-05-15: 10 meq via INTRAVENOUS
  Filled 2018-05-15: qty 100

## 2018-05-15 MED ORDER — POTASSIUM CHLORIDE 10 MEQ/100ML IV SOLN
10.0000 meq | INTRAVENOUS | Status: AC
  Administered 2018-05-15 – 2018-05-16 (×4): 10 meq via INTRAVENOUS
  Filled 2018-05-15 (×3): qty 100

## 2018-05-15 NOTE — ED Provider Notes (Addendum)
[facility] EMERGENCY DEPARTMENT Provider Note   CSN: [id number] Arrival date & time: 05/15/18  1936     History   Chief Complaint Chief Complaint  Patient presents with  . Chest Pain    HPI Eileen Nguyen is a 72 y.o. female.  HPI Complains of anterior chest pain rating to right shoulder onset 2 or 3 hours ago described as dull onset at rest.  Nonexertional associated with shortness of breath.  EMS treated patient with 1 sublingual nitroglycerin and 4 baby aspirins, without relief however her blood pressure did drop after nitroglycerin administered.  She denies nausea or sweatiness.  No other complaint.  Denies neck pain.  Denies back pain.  Nothing makes symptoms better or worse.  No other associated symptoms no nausea or sweatiness Past Medical History:  Diagnosis Date  . Acid reflux   . Degenerative disc disease, lumbar   . Hiatal hernia with GERD   . Hypertension   . Psoriasis     There are no active problems to display for this patient.   History reviewed. No pertinent surgical history.   OB History   None      Home Medications    Prior to Admission medications   Medication Sig Start Date End Date Taking? Authorizing Provider  chlorthalidone (HYGROTON) 25 MG tablet Take 25 mg by mouth daily.    [provider]  lidocaine (XYLOCAINE) 2 % solution Use as directed 15 mLs in the mouth or throat every 3 (three) hours as needed for mouth pain. Patient not taking: Reported on 12/29/2017 08/09/17   Frederik Pear A, PA-C  omeprazole (PRILOSEC) 20 MG capsule Take 1 capsule (20 mg total) by mouth 2 (two) times daily. 08/09/17 12/29/17  McDonald, Mia A, PA-C  ondansetron (ZOFRAN ODT) 8 MG disintegrating tablet Take 1 tablet (8 mg total) by mouth every 8 (eight) hours as needed for nausea or vomiting. 12/29/17   Azalia Bilis, MD  potassium chloride SA (K-DUR,KLOR-CON) 20 MEQ tablet Take 1 tablet (20 mEq total) by mouth daily. 03/28/15   Mancel Bale,  MD    Family History History reviewed. No pertinent family history. Family history negative for cardiac disease Social History Social History   Tobacco Use  . Smoking status: Never Smoker  . Smokeless tobacco: Never Used  Substance Use Topics  . Alcohol use: No  . Drug use: No  ex Smoker quit smoking approximately 50 years ago   Allergies   Beta adrenergic blockers; Codeine; and Other   Review of Systems Review of Systems  Constitutional: Negative.   HENT: Negative.   Respiratory: Positive for shortness of breath.   Cardiovascular: Positive for chest pain.  Gastrointestinal: Negative.   Musculoskeletal: Negative.   Skin: Positive for rash.       Chronic psoriatic rash  Neurological: Positive for headaches.       Mild headache today  Psychiatric/Behavioral: Negative.   All other systems reviewed and are negative.    Physical Exam Updated Vital Signs BP 118/76   Pulse 81   Temp 97.7 F (36.5 C)   Resp 17   SpO2 99%   Physical Exam  Constitutional: She appears well-developed and well-nourished.  HENT:  Head: Normocephalic and atraumatic.  Eyes: Pupils are equal, round, and reactive to light. Conjunctivae are normal.  Neck: Neck supple. No tracheal deviation present. No thyromegaly present.  Cardiovascular: Normal rate and regular rhythm.  No murmur heard. Pulmonary/Chest: Effort normal and breath sounds normal.  Abdominal:  Soft. Bowel sounds are normal. She exhibits no distension. There is no tenderness.  Musculoskeletal: Normal range of motion. She exhibits no edema or tenderness.  Neurological: She is alert. Coordination normal.  Skin: Skin is warm and dry. Rash noted.  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  Nursing note and vitals reviewed.    ED Treatments / Results  Labs (all labs ordered are listed, but only abnormal results are displayed) Labs Reviewed  D-DIMER, QUANTITATIVE (NOT  AT [facility])  CBC WITH DIFFERENTIAL/PLATELET  BASIC METABOLIC PANEL  I-STAT TROPONIN, ED    EKG EKG Interpretation  Date/Time:  Tuesday May 15 2018 19:52:41 EST Ventricular Rate:  82 PR Interval:    QRS Duration: 110 QT Interval:  408 QTC Calculation: 477 R Axis:   -21 Text Interpretation:  Sinus rhythm Borderline short PR interval Probable left atrial enlargement Borderline left axis deviation Borderline repolarization abnormality No significant change since last tracing Confirmed by Doug Sou [phone]) on 05/15/2018 7:58:30 PM   Radiology No results found.  Procedures Procedures (including critical care time)  Medications Ordered in ED Medications - No data to display  Chest x-ray viewed by me. Initial Impression / Assessment and Plan / ED Course  I have reviewed the triage vital signs and the nursing notes.  Pertinent labs & imaging results that were available during my care of the patient were reviewed by me and considered in my medical decision making (see chart for details).     930 p.m. patient asymptomatic pain-free. Chest pain is atypical for acute coronary syndrome.  Heart score equals 3.  She has negative d-dimer and low pretest probability for pulmonary embolism.  Lab work is consistent with persistent hypokalemia after treatment with both oral and intravenous potassium.  I have ordered additional intravenous potassium.  I do not feel the patient needs to be admitted for further evaluation of chest pain however herserum  potassium does not need to be improved upon.  Patient signed out to Dr. Elesa Massed 11:30 PM   Final Clinical Impressions(s) / ED Diagnoses  dx#1 atypical chest pain #2 hypokalemia Final diagnoses:  None    ED Discharge Orders    None       Doug Sou, MD 05/16/18 0032 CRITICAL CARE Performed by: Doug Sou Total critical care time: 30 minutes Critical care time was exclusive of separately billable procedures and treating  other patients. Critical care was necessary to treat or prevent imminent or life-threatening deterioration. Critical care was time spent personally by me on the following activities: development of treatment plan with patient and/or surrogate as well as nursing, discussions with consultants, evaluation of patient's response to treatment, examination of patient, obtaining history from patient or surrogate, ordering and performing treatments and interventions, ordering and review of laboratory studies, ordering and review of radiographic studies, pulse oximetry and re-evaluation of patient's condition.   Doug Sou, MD 05/28/18 1054

## 2018-05-15 NOTE — ED Notes (Signed)
Call neighbor Tylene Fantasiaeresa Simpson when ready to discharge @ [phone]

## 2018-05-15 NOTE — ED Provider Notes (Signed)
11:30 PM  Assumed care from Dr. Ethelda ChickJacubowitz.  Patient is a 72 year old female who presented with atypical chest pain.  Has had 2- troponins, negative d-dimer.  Potassium found to be 2.5.  Has increased to 2.6 after 10 mEq of IV potassium, 60 mEq of oral potassium.  No prolonged QT interval.  Magnesium is normal.  Plan is to continue to replenish potassium and recheck in as long as this has improved, patient will be discharged home with outpatient follow-up.  1:20 AM  Pt upset that she will be here overnight receiving IV potassium.  Explained to patient's the importance of receiving potassium replacement.  Patient refuses to answer questions for this physician or talk to this physician.  She may decide to leave AGAINST MEDICAL ADVICE.  Nursing staff at bedside with patient as well.  They are updated with plan.   1:45 AM  Pt now agrees to stay.  Repeat Chem-8 will be drawn after 40 mEq of IV potassium complete.  4:45 AM  Pt's repeat potassium is now 4.1.  Will discharge home.  She can follow-up with her primary care physician.   At this time, I do not feel there is any life-threatening condition present. I have reviewed and discussed all results (EKG, imaging, lab, urine as appropriate) and exam findings with patient/family. I have reviewed nursing notes and appropriate previous records.  I feel the patient is safe to be discharged home without further emergent workup and can continue workup as an outpatient as needed. Discussed usual and customary return precautions. Patient/family verbalize understanding and are comfortable with this plan.  Outpatient follow-up has been provided if needed. All questions have been answered.    Briany Aye, Layla MawKristen N, DO 05/16/18 [phone]

## 2018-05-15 NOTE — ED Triage Notes (Signed)
Pt reports intermittent R side CP radiating to R neck and R arm. Pt reports SHOB, some nausea. Pt given 324 ASA and 1 nitro. Pt's BP dropped with 1 nitro, none additional administered. PT alert and oriented at this time.

## 2018-05-16 DIAGNOSIS — R0789 Other chest pain: Secondary | ICD-10-CM | POA: Diagnosis not present

## 2018-05-16 LAB — I-STAT CHEM 8, ED
BUN: 11 mg/dL (ref 8–23)
Calcium, Ion: 1.25 mmol/L (ref 1.15–1.40)
Chloride: 105 mmol/L (ref 98–111)
Creatinine, Ser: 0.8 mg/dL (ref 0.44–1.00)
Glucose, Bld: 86 mg/dL (ref 70–99)
HCT: 37 % (ref 36.0–46.0)
Hemoglobin: 12.6 g/dL (ref 12.0–15.0)
Potassium: 4.1 mmol/L (ref 3.5–5.1)
Sodium: 140 mmol/L (ref 135–145)
TCO2: 28 mmol/L (ref 22–32)

## 2018-05-16 NOTE — ED Notes (Signed)
Pt ambulated to RR to place clothes back on.

## 2018-05-16 NOTE — ED Notes (Signed)
Call neighbor Tylene Fantasiaeresa Simpson when ready to discharge @ [phone].

## 2018-05-16 NOTE — Discharge Instructions (Addendum)
Your potassium level was low at 2.5 but has been replaced and is now normal at 4.1.  This is likely secondary to the blood pressure medication that you are taking called chlorthalidone.  Please follow-up with your primary care physician as they may want to adjust your blood pressure medication.  You have had normal cardiac labs today and a blood test that rules out blood clots.  Please follow-up with your primary care physician for further evaluation of your right-sided chest pain.

## 2018-08-09 ENCOUNTER — Ambulatory Visit: Payer: Self-pay | Admitting: Podiatry

## 2018-08-10 ENCOUNTER — Ambulatory Visit: Payer: Self-pay | Admitting: Podiatry

## 2018-08-16 ENCOUNTER — Ambulatory Visit (INDEPENDENT_AMBULATORY_CARE_PROVIDER_SITE_OTHER): Payer: 59

## 2018-08-16 ENCOUNTER — Encounter

## 2018-08-16 ENCOUNTER — Encounter: Payer: Self-pay | Admitting: Podiatry

## 2018-08-16 ENCOUNTER — Ambulatory Visit (INDEPENDENT_AMBULATORY_CARE_PROVIDER_SITE_OTHER): Payer: 59 | Admitting: Podiatry

## 2018-08-16 ENCOUNTER — Other Ambulatory Visit: Payer: Self-pay | Admitting: Podiatry

## 2018-08-16 DIAGNOSIS — M21621 Bunionette of right foot: Secondary | ICD-10-CM | POA: Diagnosis not present

## 2018-08-16 DIAGNOSIS — L409 Psoriasis, unspecified: Secondary | ICD-10-CM | POA: Diagnosis not present

## 2018-08-16 DIAGNOSIS — M21962 Unspecified acquired deformity of left lower leg: Secondary | ICD-10-CM

## 2018-08-16 DIAGNOSIS — M2012 Hallux valgus (acquired), left foot: Secondary | ICD-10-CM

## 2018-08-16 DIAGNOSIS — M25572 Pain in left ankle and joints of left foot: Secondary | ICD-10-CM

## 2018-08-16 DIAGNOSIS — M21622 Bunionette of left foot: Secondary | ICD-10-CM

## 2018-08-16 DIAGNOSIS — M79672 Pain in left foot: Secondary | ICD-10-CM

## 2018-08-16 DIAGNOSIS — M2011 Hallux valgus (acquired), right foot: Secondary | ICD-10-CM | POA: Diagnosis not present

## 2018-08-16 MED ORDER — TRIAMCINOLONE ACETONIDE 0.025 % EX CREA: TOPICAL_CREAM | CUTANEOUS | 0 refills | Status: DC

## 2018-08-17 ENCOUNTER — Telehealth: Payer: Self-pay | Admitting: *Deleted

## 2018-08-17 NOTE — Telephone Encounter (Signed)
Referral ordered, demographics printed to be faxed to [facility] once 08/16/2018 clinicals are available.

## 2018-08-21 NOTE — Telephone Encounter (Signed)
Faxed referral, received 08/16/2018 clinicals, and demographics to [facility].

## 2018-08-21 NOTE — Progress Notes (Signed)
Subjective:  Patient ID: Eileen Nguyen, female    DOB: [birth year],  MRN: [id number]  Chief Complaint  Patient presents with  . Foot Pain    Pt states left heel pain  . Bunions    Bunions on medial and lateral side pain 68yr duration   73 y.o. female presents with the above complaint.  Reports painful bumps on the inside outside of her foot worse with shoe gear.  Present for about 3 years.  Also complains of pain to the left heel.  Review of Systems: Negative except as noted in the HPI. Denies N/V/F/Ch.  Past Medical History:  Diagnosis Date  . Acid reflux   . Degenerative disc disease, lumbar   . Hiatal hernia with GERD   . Hypertension   . Psoriasis     Current Outpatient Medications:  .  chlorthalidone (HYGROTON) 25 MG tablet, Take 25 mg by mouth daily., Disp: , Rfl:  .  lidocaine (XYLOCAINE) 2 % solution, Use as directed 15 mLs in the mouth or throat every 3 (three) hours as needed for mouth pain., Disp: 100 mL, Rfl: 0 .  ondansetron (ZOFRAN ODT) 8 MG disintegrating tablet, Take 1 tablet (8 mg total) by mouth every 8 (eight) hours as needed for nausea or vomiting., Disp: 10 tablet, Rfl: 0 .  potassium chloride SA (K-DUR,KLOR-CON) 20 MEQ tablet, Take 1 tablet (20 mEq total) by mouth daily., Disp: 7 tablet, Rfl: 0 .  omeprazole (PRILOSEC) 20 MG capsule, Take 1 capsule (20 mg total) by mouth 2 (two) times daily., Disp: 60 capsule, Rfl: 0 .  triamcinolone (KENALOG) 0.025 % cream, Apply fingertip amount to affected areas of feet twice daily., Disp: 30 g, Rfl: 0  Social History   Tobacco Use  Smoking Status Never Smoker  Smokeless Tobacco Never Used    Allergies  Allergen Reactions  . Beta Adrenergic Blockers Rash and Other (See Comments)    Worsens psoriasis  . Codeine Nausea And Vomiting  . Other Other (See Comments)    Per a [facility] doctor, patient was told to "never use any inhalers" (was told it would worsen her psoriasis)   Objective:  There were no vitals filed for  this visit. There is no height or weight on file to calculate BMI. Constitutional Well developed. Well nourished.  Vascular Dorsalis pedis pulses palpable bilaterally. Posterior tibial pulses palpable bilaterally. Capillary refill normal to all digits.  No cyanosis or clubbing noted. Pedal hair growth normal.  Neurologic Normal speech. Oriented to person, place, and time. Epicritic sensation to light touch grossly present bilaterally.  Dermatologic Nails well groomed and normal in appearance. No open wounds. Multiple psoriatic plaques left foot plantar and moccasin distribution  Orthopedic: Hallux abductovalgus deformity, tailor's bunion deformity with pain palpation   Radiographs: Taken and reviewed HAV and tailor's bunion deformities Assessment:   1. Tailor's bunion of both feet   2. Acquired hallux valgus of both feet   3. Deformity of metatarsal bone of left foot   4. Psoriasis   5. Pain in joint of left foot    Plan:  Patient was evaluated and treated and all questions answered.  Hallux valgus, tailor's bunion left foot -X-rays reviewed as above -Discussed with patient that she would likely benefit from surgical correction however we need to control psoriasis first -Discussed padding and proper shoe gear changes  Psoriasis, Uncontrolled -Rx triamcinolone -We will place referral to rheumatology.  I discussed with patient that given the psoriatic plaques about the area of  the incisions that she would need to have her surgery the psoriasis will have to be under better control prior to surgery.  Return in about 6 weeks (around 09/27/2018) for Psoriasis f/u, possible surgery.

## 2018-08-28 ENCOUNTER — Other Ambulatory Visit: Payer: Self-pay | Admitting: Family Medicine

## 2018-08-28 DIAGNOSIS — E049 Nontoxic goiter, unspecified: Secondary | ICD-10-CM

## 2018-09-18 ENCOUNTER — Other Ambulatory Visit: Payer: Self-pay | Admitting: Urgent Care

## 2018-09-18 DIAGNOSIS — Z1231 Encounter for screening mammogram for malignant neoplasm of breast: Secondary | ICD-10-CM

## 2018-09-21 ENCOUNTER — Encounter: Payer: Self-pay | Admitting: Gastroenterology

## 2018-09-27 ENCOUNTER — Ambulatory Visit: Payer: 59 | Admitting: Podiatry

## 2018-09-28 ENCOUNTER — Ambulatory Visit: Payer: 59 | Admitting: Rheumatology

## 2018-11-10 ENCOUNTER — Encounter (HOSPITAL_COMMUNITY): Payer: Self-pay | Admitting: Emergency Medicine

## 2018-11-10 ENCOUNTER — Emergency Department (HOSPITAL_COMMUNITY): Payer: 59

## 2018-11-10 ENCOUNTER — Emergency Department (HOSPITAL_COMMUNITY)
Admission: EM | Admit: 2018-11-10 | Discharge: 2018-11-10 | Disposition: A | Discharge status: Home / Self Care | Payer: 59 | Attending: Emergency Medicine | Admitting: Emergency Medicine

## 2018-11-10 ENCOUNTER — Other Ambulatory Visit: Payer: Self-pay

## 2018-11-10 DIAGNOSIS — Z79899 Other long term (current) drug therapy: Secondary | ICD-10-CM | POA: Insufficient documentation

## 2018-11-10 DIAGNOSIS — Y9301 Activity, walking, marching and hiking: Secondary | ICD-10-CM | POA: Diagnosis not present

## 2018-11-10 DIAGNOSIS — Y92481 Parking lot as the place of occurrence of the external cause: Secondary | ICD-10-CM | POA: Diagnosis not present

## 2018-11-10 DIAGNOSIS — W010XXA Fall on same level from slipping, tripping and stumbling without subsequent striking against object, initial encounter: Secondary | ICD-10-CM | POA: Diagnosis not present

## 2018-11-10 DIAGNOSIS — S59911A Unspecified injury of right forearm, initial encounter: Secondary | ICD-10-CM | POA: Diagnosis present

## 2018-11-10 DIAGNOSIS — S52124A Nondisplaced fracture of head of right radius, initial encounter for closed fracture: Secondary | ICD-10-CM | POA: Diagnosis not present

## 2018-11-10 DIAGNOSIS — Y999 Unspecified external cause status: Secondary | ICD-10-CM | POA: Diagnosis not present

## 2018-11-10 DIAGNOSIS — I10 Essential (primary) hypertension: Secondary | ICD-10-CM | POA: Insufficient documentation

## 2018-11-10 DIAGNOSIS — W19XXXA Unspecified fall, initial encounter: Secondary | ICD-10-CM

## 2018-11-10 MED ORDER — ONDANSETRON HCL 4 MG PO TABS: 4.0000 mg | ORAL_TABLET | Freq: Once | ORAL | Status: DC

## 2018-11-10 MED ORDER — HYDROCODONE-ACETAMINOPHEN 5-325 MG PO TABS: 1.0000 | ORAL_TABLET | Freq: Once | ORAL | Status: DC

## 2018-11-10 NOTE — ED Triage Notes (Addendum)
Pt arrives via ems for fall and arm injury. Pt was at [employer] and tripped and fell in the parking lot catching herself with her right hand to the pavement. Pt also caught her self with her right knee. Pt rates her pain a 6 in her arm. Pt states she doesn't want pain medicine. Denies LOC. SOC, or Chest pain.

## 2018-11-10 NOTE — ED Notes (Signed)
Pt states she did not want to take anything for pain.

## 2018-11-10 NOTE — ED Notes (Signed)
Patient transported to X-ray 

## 2018-11-10 NOTE — ED Provider Notes (Signed)
[facility] EMERGENCY DEPARTMENT Provider Note   CSN: [id number] Arrival date & time: 11/10/18  1549    History   Chief Complaint Chief Complaint  Patient presents with  . Fall  . Arm Injury    HPI 73 year old female with a history of HTN, cirrhosis, GERD presents after having a mechanical fall in the [employer] parking lot.  Patient states that she tripped and fell on her right arm and knee.  She complains of right arm pain that is sharp and radiates from her wrist into her elbow.  She also complains of right knee pain.  She denies her head or lose consciousness.  Denies neck, back, or hip pain.  No preceding chest pain or shortness of breath before the fall.  No recent syncopal episode.  Denies recent illnesses.  Not on systemic anticoagulation.  Denies headache or vision changes.  She currently rates her pain is 5/10.  She is right-hand dominant.  She lives at home with her husband.  Past Medical History:  Diagnosis Date  . Acid reflux   . Degenerative disc disease, lumbar   . Hiatal hernia with GERD   . Hypertension   . Psoriasis     Patient Active Problem List   Diagnosis Date Noted  . Gastroesophageal reflux disease 03/26/2018  . Hiatal hernia 01/06/2016  . Hypokalemia 01/06/2016  . RMSF [facility] spotted fever) 01/06/2016  . Anxiety 04/10/2014  . HTN (hypertension) 04/10/2014  . Preventative health care 04/10/2014  . Psoriasis 04/10/2014    History reviewed. No pertinent surgical history.   OB History   No obstetric history on file.      Home Medications    Prior to Admission medications   Medication Sig Start Date End Date Taking? Authorizing Provider  chlorthalidone (HYGROTON) 25 MG tablet Take 25 mg by mouth daily.   Yes [provider]  meclizine (ANTIVERT) 12.5 MG tablet Take 12.5 mg by mouth daily as needed for dizziness. 09/26/18  Yes [provider]  omeprazole (PRILOSEC) 20 MG capsule Take 1 capsule (20 mg  total) by mouth 2 (two) times daily. 08/09/17 11/10/18 Yes McDonald, Mia A, PA-C  potassium chloride SA (K-DUR,KLOR-CON) 20 MEQ tablet Take 1 tablet (20 mEq total) by mouth daily. Patient taking differently: Take 20 mEq by mouth 2 (two) times daily.  03/28/15  Yes Mancel Bale, MD  lidocaine (XYLOCAINE) 2 % solution Use as directed 15 mLs in the mouth or throat every 3 (three) hours as needed for mouth pain. Patient not taking: Reported on 11/10/2018 08/09/17   McDonald, Pedro Earls A, PA-C  ondansetron (ZOFRAN ODT) 8 MG disintegrating tablet Take 1 tablet (8 mg total) by mouth every 8 (eight) hours as needed for nausea or vomiting. Patient not taking: Reported on 11/10/2018 12/29/17   Azalia Bilis, MD  triamcinolone (KENALOG) 0.025 % cream Apply fingertip amount to affected areas of feet twice daily. Patient not taking: Reported on 11/10/2018 08/16/18   Park Liter, DPM    Family History No family history on file.  Social History Social History   Tobacco Use  . Smoking status: Never Smoker  . Smokeless tobacco: Never Used  Substance Use Topics  . Alcohol use: No  . Drug use: No     Allergies   Beta adrenergic blockers; Codeine; Amlodipine; and Other   Review of Systems Review of Systems  Constitutional: Negative for chills and fever.  HENT: Negative for ear pain and sore throat.   Eyes: Negative for  pain and visual disturbance.  Respiratory: Negative for cough and shortness of breath.   Cardiovascular: Negative for chest pain and palpitations.  Gastrointestinal: Negative for abdominal pain and vomiting.  Genitourinary: Negative for dysuria and hematuria.  Musculoskeletal: Negative for arthralgias and back pain.       Right arm pain  Right leg pain   Skin: Negative for color change and rash.  Neurological: Negative for seizures and syncope.  All other systems reviewed and are negative.    Physical Exam Updated Vital Signs BP 123/77   Pulse 72   Temp 98.3 F (36.8 C) (Oral)    Resp 14   Ht 5\' 2"  (1.575 m)   Wt 65.8 kg   SpO2 98%   BMI 26.53 kg/m   Physical Exam Vitals signs and nursing note reviewed.  Constitutional:      General: She is not in acute distress.    Appearance: She is well-developed.  HENT:     Head: Normocephalic and atraumatic.     Right Ear: External ear normal.     Left Ear: External ear normal.  Eyes:     Conjunctiva/sclera: Conjunctivae normal.  Neck:     Musculoskeletal: Normal range of motion and neck supple.     Comments: No cervical tenderness to palpation Cardiovascular:     Rate and Rhythm: Normal rate and regular rhythm.     Heart sounds: No murmur.  Pulmonary:     Effort: Pulmonary effort is normal. No respiratory distress.     Breath sounds: Normal breath sounds.  Abdominal:     Palpations: Abdomen is soft.     Tenderness: There is no abdominal tenderness.  Musculoskeletal:     Comments: INSPECTION, ALIGNMENT & PALPATION: No gross deformity. No open wounds. No swelling or ecchymosis. No masses, crepitance, or effusion.  Tenderness palpation over the distal humerus, elbow, forearm, and wrist.  ROM: Decreased ROM of the elbow due to pain. Intact AROM and PROM of the shoulder and wrist.`  SENSORY: sensation is intact to light touch in:  superficial radial nerve distribution (dorsal first web space) median nerve distribution (tip of index finger) ulnar nerve distribution (tip of small finger)  MOTOR:  + motor posterior interosseous nerve (thumb IP extension) + anterior interosseous nerve (thumb IP flexion, index finger DIP flexion) + radial nerve (wrist extension) + median nerve (palpable firing thenar mass) + ulnar nerve (palpable firing of first dorsal interosseous muscle)  VASCULAR: 2+ radial pulse, brisk capillary refill < 2 sec, fingers warm and well-perfused  COMPARTMENTS: Soft and compressible. No pain with passive stretch. No paresthesias.   INSPECTION, ALIGNMENT & PALPATION: No gross deformity.  Tenderness to palpation over the lateral knee with associated swelling and small area of bruising.   No masses, crepitance, or effusion.  ROM: Decreased ROM of knee due to pain. Intact AROM and PROM of the hip, ankle, and foot  SENSORY: sensation is intact to light touch in:  superficial peroneal nerve distribution (over dorsum of foot) deep peroneal nerve distribution (over first dorsal web space) sural nerve distribution (posterior to lateral malleolus) saphenous nerve distribution (over medial malleolus) medial plantar nerve distribution (medial sole of foot anteriorly) lateral plantar nerve distribution (lateral sole of foot anteriorly)  MOTOR:  + motor EHL (great toe dorsiflexion) + FHL (great toe plantar flexion)  + TA (ankle dorsiflexion)  + GSC (ankle plantar flexion)  VASCULAR: 2+ dorsalis pedis and posterior tibialis pulses, capillary refill < 2 sec, toes warm and well-perfused  COMPARTMENTS:  Soft and compressible. No pain with passive stretch. No paresthesias.\   Skin:    General: Skin is warm and dry.     Comments: Well-demarcated erythematous plaques with silvery white scale consistent with psoriasis  Neurological:     Mental Status: She is alert.      ED Treatments / Results  Labs (all labs ordered are listed, but only abnormal results are displayed) Labs Reviewed - No data to display  EKG None  Radiology DG Humerus Right  Final Result    DG Elbow 2 Views Right  Final Result    DG Forearm Right  Final Result    DG Wrist Complete Right  Final Result    DG Knee 2 Views Right  Final Result    DG Tibia/Fibula Right  Final Result       Procedures Procedures (including critical care time)  Medications Ordered in ED Medications  HYDROcodone-acetaminophen (NORCO/VICODIN) 5-325 MG per tablet 1 tablet (1 tablet Oral Not Given 11/10/18 1610)  ondansetron (ZOFRAN) tablet 4 mg (4 mg Oral Not Given 11/10/18 1611)     Initial Impression / Assessment  and Plan / ED Course  I have reviewed the triage vital signs and the nursing notes.  Pertinent labs & imaging results that were available during my care of the patient were reviewed by me and considered in my medical decision making (see chart for details).  73 year old female with a history of HTN, cirrhosis, GERD presents after having a mechanical fall in the [employer] parking lot.  Hemodynamically stable.  Afebrile.  She complains of right arm and right knee pain.  No obvious deformities on exam.  Extremities are neurovascularly intact.  No snuffbox tenderness.  Patient denies her head and she denies headache or neck pain.  Not on systemic anticoagulation.  I have low clinical suspicion for serious intracranial pathology requiring CT imaging at this time.  PO Norco given for pain.  X-rays notable for angulation of the right radial neck appreciated on lateral view that suspicious for fracture.  Large right elbow joint effusion.  No other evidence of fracture of the forearm or wrist.  Patient placed in a sling for comfort with plans to follow with orthopedics as outpatient.    Final Clinical Impressions(s) / ED Diagnoses   Final diagnoses:  Fall, initial encounter  Closed nondisplaced fracture of head of right radius, initial encounter    ED Discharge Orders    None       Vallery Ridge, MD 11/10/18 1840    Sabas Sous, MD 11/10/18 Jerene Bears

## 2019-02-25 ENCOUNTER — Other Ambulatory Visit: Payer: Self-pay

## 2019-02-25 DIAGNOSIS — R5381 Other malaise: Secondary | ICD-10-CM

## 2019-03-29 ENCOUNTER — Other Ambulatory Visit: Payer: Self-pay

## 2019-03-29 DIAGNOSIS — Z13820 Encounter for screening for osteoporosis: Secondary | ICD-10-CM

## 2019-08-03 ENCOUNTER — Other Ambulatory Visit: Payer: Self-pay

## 2020-07-28 ENCOUNTER — Emergency Department (HOSPITAL_COMMUNITY)
Admission: EM | Admit: 2020-07-28 | Discharge: 2020-07-29 | Disposition: A | Discharge status: Home / Self Care | Payer: 59 | Attending: Emergency Medicine | Admitting: Emergency Medicine

## 2020-07-28 DIAGNOSIS — R1032 Left lower quadrant pain: Secondary | ICD-10-CM | POA: Insufficient documentation

## 2020-07-28 DIAGNOSIS — T1490XA Injury, unspecified, initial encounter: Secondary | ICD-10-CM

## 2020-07-28 DIAGNOSIS — I10 Essential (primary) hypertension: Secondary | ICD-10-CM | POA: Diagnosis not present

## 2020-07-28 DIAGNOSIS — N390 Urinary tract infection, site not specified: Secondary | ICD-10-CM

## 2020-07-28 DIAGNOSIS — S39012A Strain of muscle, fascia and tendon of lower back, initial encounter: Secondary | ICD-10-CM

## 2020-07-28 DIAGNOSIS — Y9301 Activity, walking, marching and hiking: Secondary | ICD-10-CM | POA: Diagnosis not present

## 2020-07-28 DIAGNOSIS — R197 Diarrhea, unspecified: Secondary | ICD-10-CM | POA: Insufficient documentation

## 2020-07-28 DIAGNOSIS — Y92009 Unspecified place in unspecified non-institutional (private) residence as the place of occurrence of the external cause: Secondary | ICD-10-CM | POA: Insufficient documentation

## 2020-07-28 DIAGNOSIS — S0990XA Unspecified injury of head, initial encounter: Secondary | ICD-10-CM

## 2020-07-28 DIAGNOSIS — R32 Unspecified urinary incontinence: Secondary | ICD-10-CM | POA: Insufficient documentation

## 2020-07-28 DIAGNOSIS — Z79899 Other long term (current) drug therapy: Secondary | ICD-10-CM | POA: Diagnosis not present

## 2020-07-28 DIAGNOSIS — W133XXA Fall through floor, initial encounter: Secondary | ICD-10-CM | POA: Diagnosis not present

## 2020-07-28 DIAGNOSIS — S161XXA Strain of muscle, fascia and tendon at neck level, initial encounter: Secondary | ICD-10-CM

## 2020-07-28 DIAGNOSIS — M545 Low back pain, unspecified: Secondary | ICD-10-CM | POA: Diagnosis not present

## 2020-07-28 DIAGNOSIS — S29019A Strain of muscle and tendon of unspecified wall of thorax, initial encounter: Secondary | ICD-10-CM

## 2020-07-28 DIAGNOSIS — R55 Syncope and collapse: Secondary | ICD-10-CM | POA: Insufficient documentation

## 2020-07-29 ENCOUNTER — Emergency Department (HOSPITAL_COMMUNITY): Payer: 59

## 2020-07-29 ENCOUNTER — Other Ambulatory Visit: Payer: Self-pay

## 2020-07-29 DIAGNOSIS — M545 Low back pain, unspecified: Secondary | ICD-10-CM | POA: Diagnosis not present

## 2020-07-29 LAB — CBC WITH DIFFERENTIAL/PLATELET
Abs Immature Granulocytes: 0.02 10*3/uL (ref 0.00–0.07)
Basophils Absolute: 0 10*3/uL (ref 0.0–0.1)
Basophils Relative: 0 %
Eosinophils Absolute: 0 10*3/uL (ref 0.0–0.5)
Eosinophils Relative: 0 %
HCT: 43 % (ref 36.0–46.0)
Hemoglobin: 13.8 g/dL (ref 12.0–15.0)
Immature Granulocytes: 0 %
Lymphocytes Relative: 16 %
Lymphs Abs: 0.7 10*3/uL (ref 0.7–4.0)
MCH: 28.2 pg (ref 26.0–34.0)
MCHC: 32.1 g/dL (ref 30.0–36.0)
MCV: 87.9 fL (ref 80.0–100.0)
Monocytes Absolute: 0.6 10*3/uL (ref 0.1–1.0)
Monocytes Relative: 13 %
Neutro Abs: 3.2 10*3/uL (ref 1.7–7.7)
Neutrophils Relative %: 71 %
Platelets: 190 10*3/uL (ref 150–400)
RBC: 4.89 MIL/uL (ref 3.87–5.11)
RDW: 13.2 % (ref 11.5–15.5)
WBC: 4.5 10*3/uL (ref 4.0–10.5)
nRBC: 0 % (ref 0.0–0.2)

## 2020-07-29 LAB — URINALYSIS, ROUTINE W REFLEX MICROSCOPIC
Bilirubin Urine: NEGATIVE
Glucose, UA: NEGATIVE mg/dL
Ketones, ur: 5 mg/dL — AB
Nitrite: POSITIVE — AB
Protein, ur: NEGATIVE mg/dL
Specific Gravity, Urine: 1.017 (ref 1.005–1.030)
pH: 5 (ref 5.0–8.0)

## 2020-07-29 LAB — BASIC METABOLIC PANEL
Anion gap: 12 (ref 5–15)
BUN: 20 mg/dL (ref 8–23)
CO2: 24 mmol/L (ref 22–32)
Calcium: 9.7 mg/dL (ref 8.9–10.3)
Chloride: 103 mmol/L (ref 98–111)
Creatinine, Ser: 0.95 mg/dL (ref 0.44–1.00)
GFR, Estimated: >60 mL/min (ref >60)
Glucose, Bld: 122 mg/dL — ABNORMAL HIGH (ref 70–99)
Potassium: 2.8 mmol/L — ABNORMAL LOW (ref 3.5–5.1)
Sodium: 139 mmol/L (ref 135–145)

## 2020-07-29 MED ORDER — MORPHINE SULFATE (PF) 4 MG/ML IV SOLN
4.0000 mg | Freq: Once | INTRAVENOUS | Status: AC
  Administered 2020-07-29: 4 mg via INTRAVENOUS
  Filled 2020-07-29: qty 1

## 2020-07-29 MED ORDER — IOHEXOL 300 MG/ML  SOLN
100.0000 mL | Freq: Once | INTRAMUSCULAR | Status: AC | PRN
  Administered 2020-07-29: 100 mL via INTRAVENOUS

## 2020-07-29 MED ORDER — POTASSIUM CHLORIDE CRYS ER 20 MEQ PO TBCR
40.0000 meq | EXTENDED_RELEASE_TABLET | Freq: Once | ORAL | Status: AC
  Administered 2020-07-29: 40 meq via ORAL
  Filled 2020-07-29: qty 2

## 2020-07-29 MED ORDER — CEPHALEXIN 500 MG PO CAPS: 500.0000 mg | ORAL_CAPSULE | Freq: Three times a day (TID) | ORAL | 0 refills | Status: DC

## 2020-07-29 MED ORDER — SODIUM CHLORIDE 0.9 % IV BOLUS
500.0000 mL | Freq: Once | INTRAVENOUS | Status: AC
  Administered 2020-07-29: 500 mL via INTRAVENOUS

## 2020-07-29 MED ORDER — SODIUM CHLORIDE 0.9 % IV SOLN
1.0000 g | Freq: Once | INTRAVENOUS | Status: AC
  Administered 2020-07-29: 1 g via INTRAVENOUS
  Filled 2020-07-29: qty 10

## 2020-07-29 NOTE — ED Notes (Signed)
Pt to CT

## 2020-07-29 NOTE — ED Triage Notes (Signed)
PT BIB [facility] for a fall at home.  Pt has been experiencing some diarrhea in the last two days and states she went to the restroom but her husband was in and so she had a bit of an accident.  She was returning to the couch where she was laying prior but had a syncopal episode, fell and hit her head.    She is complaining primarily of lower back pain at this time.  EMS applied collar for precaution. Pt is not on blood thinners and neuro checks were clear for ems. 12-lead was unremarkable. CBG 168.   PT had some nausea and received 4mg  Zofran en route.   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

## 2020-07-29 NOTE — Discharge Instructions (Addendum)
Begin taking Keflex as prescribed for your urinary tract infection.  Follow-up with your primary doctor in the next week, and return to the ER if you develop any new and/or concerning symptoms.

## 2020-07-29 NOTE — ED Notes (Signed)
C collar removed per MD 

## 2020-07-29 NOTE — ED Provider Notes (Signed)
[facility] EMERGENCY DEPARTMENT Provider Note   CSN: [id number] Arrival date & time: 07/28/20  2357     History Chief Complaint  Patient presents with  . Fall    Eileen Nguyen is a 75 y.o. female.  Patient is a 75 year old female with past medical history of hypertension, GERD, psoriasis.  Patient presents today for evaluation of fall.  Patient states she got up from the couch to go to the bathroom.  When she was walking back to the couch she tells me she "passed out", then fell on the floor.  She is complaining of pain in her low back and upper back.  She denies any numbness or tingling of the extremities.  She also complains of pain in her left side that has been present in her abdomen since yesterday.  She reports urinary incontinence and frequency.  She denies fevers or chills.  She did have some diarrhea recently.  The history is provided by the patient.  Fall This is a new problem. The current episode started less than 1 hour ago. The problem occurs constantly. The problem has not changed since onset.Nothing aggravates the symptoms. Nothing relieves the symptoms. She has tried nothing for the symptoms.       Past Medical History:  Diagnosis Date  . Acid reflux   . Degenerative disc disease, lumbar   . Hiatal hernia with GERD   . Hypertension   . Psoriasis     Patient Active Problem List   Diagnosis Date Noted  . Gastroesophageal reflux disease 03/26/2018  . Hiatal hernia 01/06/2016  . Hypokalemia 01/06/2016  . RMSF [facility] spotted fever) 01/06/2016  . Anxiety 04/10/2014  . HTN (hypertension) 04/10/2014  . Preventative health care 04/10/2014  . Psoriasis 04/10/2014    No past surgical history on file.   OB History   No obstetric history on file.     No family history on file.  Social History   Tobacco Use  . Smoking status: Never Smoker  . Smokeless tobacco: Never Used  Substance Use Topics  . Alcohol use: No  . Drug  use: No    Home Medications Prior to Admission medications   Medication Sig Start Date End Date Taking? Authorizing Provider  chlorthalidone (HYGROTON) 25 MG tablet Take 25 mg by mouth daily.    [provider]  lidocaine (XYLOCAINE) 2 % solution Use as directed 15 mLs in the mouth or throat every 3 (three) hours as needed for mouth pain. Patient not taking: Reported on 11/10/2018 08/09/17   Frederik Pear A, PA-C  meclizine (ANTIVERT) 12.5 MG tablet Take 12.5 mg by mouth daily as needed for dizziness. 09/26/18   [provider]  omeprazole (PRILOSEC) 20 MG capsule Take 1 capsule (20 mg total) by mouth 2 (two) times daily. 08/09/17 11/10/18  McDonald, Mia A, PA-C  ondansetron (ZOFRAN ODT) 8 MG disintegrating tablet Take 1 tablet (8 mg total) by mouth every 8 (eight) hours as needed for nausea or vomiting. Patient not taking: Reported on 11/10/2018 12/29/17   Azalia Bilis, MD  potassium chloride SA (K-DUR,KLOR-CON) 20 MEQ tablet Take 1 tablet (20 mEq total) by mouth daily. Patient taking differently: Take 20 mEq by mouth 2 (two) times daily.  03/28/15   Mancel Bale, MD  triamcinolone (KENALOG) 0.025 % cream Apply fingertip amount to affected areas of feet twice daily. Patient not taking: Reported on 11/10/2018 08/16/18   Park Liter, DPM    Allergies  Beta adrenergic blockers, Codeine, Amlodipine, and Other  Review of Systems   Review of Systems  All other systems reviewed and are negative.   Physical Exam Updated Vital Signs BP 114/66 (BP Location: Left Arm)   Pulse 73   Temp (!) 93.7 F (34.3 C) (Axillary)   Resp 16   SpO2 98%   Physical Exam Vitals and nursing note reviewed.  Constitutional:      General: She is not in acute distress.    Appearance: She is well-developed and well-nourished. She is not diaphoretic.  HENT:     Head: Normocephalic and atraumatic.  Neck:     Comments: There is tenderness to palpation in the soft tissues of the cervical region.   There is no bony tenderness or step-off. Cardiovascular:     Rate and Rhythm: Normal rate and regular rhythm.     Heart sounds: No murmur heard. No friction rub. No gallop.   Pulmonary:     Effort: Pulmonary effort is normal. No respiratory distress.     Breath sounds: Normal breath sounds. No wheezing.  Abdominal:     General: Bowel sounds are normal. There is no distension.     Palpations: Abdomen is soft.     Tenderness: There is no abdominal tenderness.  Musculoskeletal:        General: Normal range of motion.     Cervical back: Normal range of motion and neck supple.     Comments: There is tenderness to palpation in the lower lumbar region.  There is no bony tenderness or step-off.  Skin:    General: Skin is warm and dry.  Neurological:     General: No focal deficit present.     Mental Status: She is alert and oriented to person, place, and time.     Cranial Nerves: No cranial nerve deficit.     Sensory: No sensory deficit.     Motor: No weakness.     Comments: Patient able to move all extremities and sensation is intact throughout.     ED Results / Procedures / Treatments   Labs (all labs ordered are listed, but only abnormal results are displayed) Labs Reviewed - No data to display  EKG EKG Interpretation  Date/Time:  Wednesday July 29 2020 01:08:59 EST Ventricular Rate:  78 PR Interval:  124 QRS Duration: 106 QT Interval:  376 QTC Calculation: 428 R Axis:   35 Text Interpretation: Normal sinus rhythm Nonspecific T wave abnormality Abnormal ECG No significant change since 05/15/2018 Confirmed by Geoffery Lyons ([id number]) on 07/29/2020 1:24:35 AM   Radiology No results found.  Procedures Procedures   Medications Ordered in ED Medications  sodium chloride 0.9 % bolus 500 mL (has no administration in time range)  morphine 4 MG/ML injection 4 mg (has no administration in time range)    ED Course  I have reviewed the triage vital signs and the nursing  notes.  Pertinent labs & imaging results that were available during my care of the patient were reviewed by me and considered in my medical decision making (see chart for details).    MDM Rules/Calculators/A&P  Patient is a 75 year old female presenting with complaints of syncope and back pain.  Patient states she was walking back from the bathroom when she passed out and landed on the floor.  I am uncertain as to the etiology of her syncope, but it sounds vasovagal given that she just returned from using the restroom.  Patient arrives here with stable vital  signs.  She appears clinically well, but is complaining of pain throughout her back.  Patient's work-up includes CT scan of the head, cervical spine, thoracic spine, and abdomen/pelvis/lumbar spine.  All of the studies are essentially unremarkable showing no fracture and nothing that would be responsible for causing the episode she experienced.  Her laboratory studies are unremarkable.  She has no fever and no white count, but does have evidence for UTI.  Patient given IV Rocephin and will be discharged with Keflex.  She also has potassium level of 2.8 and was given oral potassium here.  She does seem to run a chronically low potassium level based on her prior studies.  At this point, discharge seems appropriate.  Patient is to follow-up with primary doctor and return as needed.  She has been offered, but declines medication for pain.  Final Clinical Impression(s) / ED Diagnoses Final diagnoses:  None    Rx / DC Orders ED Discharge Orders    None       Geoffery Lyons, MD 07/29/20 [phone]

## 2022-11-02 ENCOUNTER — Encounter (HOSPITAL_COMMUNITY): Payer: Self-pay | Admitting: Internal Medicine

## 2022-11-02 ENCOUNTER — Emergency Department (HOSPITAL_COMMUNITY): Payer: 59

## 2022-11-02 ENCOUNTER — Inpatient Hospital Stay (HOSPITAL_COMMUNITY)
Admission: EM | Admit: 2022-11-02 | Discharge: 2022-12-03 | DRG: 521 | Disposition: E | Payer: 59 | Attending: Pulmonary Disease | Admitting: Pulmonary Disease

## 2022-11-02 ENCOUNTER — Other Ambulatory Visit: Payer: Self-pay

## 2022-11-02 DIAGNOSIS — L409 Psoriasis, unspecified: Secondary | ICD-10-CM | POA: Diagnosis present

## 2022-11-02 DIAGNOSIS — I468 Cardiac arrest due to other underlying condition: Secondary | ICD-10-CM | POA: Diagnosis not present

## 2022-11-02 DIAGNOSIS — I959 Hypotension, unspecified: Secondary | ICD-10-CM | POA: Diagnosis not present

## 2022-11-02 DIAGNOSIS — F03B18 Unspecified dementia, moderate, with other behavioral disturbance: Secondary | ICD-10-CM | POA: Diagnosis present

## 2022-11-02 DIAGNOSIS — Z681 Body mass index (BMI) 19 or less, adult: Secondary | ICD-10-CM | POA: Diagnosis not present

## 2022-11-02 DIAGNOSIS — J9601 Acute respiratory failure with hypoxia: Secondary | ICD-10-CM | POA: Diagnosis not present

## 2022-11-02 DIAGNOSIS — W19XXXA Unspecified fall, initial encounter: Secondary | ICD-10-CM

## 2022-11-02 DIAGNOSIS — Z7189 Other specified counseling: Secondary | ICD-10-CM | POA: Diagnosis not present

## 2022-11-02 DIAGNOSIS — A419 Sepsis, unspecified organism: Secondary | ICD-10-CM | POA: Diagnosis not present

## 2022-11-02 DIAGNOSIS — W1830XA Fall on same level, unspecified, initial encounter: Secondary | ICD-10-CM | POA: Diagnosis present

## 2022-11-02 DIAGNOSIS — G9349 Other encephalopathy: Secondary | ICD-10-CM | POA: Diagnosis present

## 2022-11-02 DIAGNOSIS — E876 Hypokalemia: Secondary | ICD-10-CM | POA: Diagnosis not present

## 2022-11-02 DIAGNOSIS — R4189 Other symptoms and signs involving cognitive functions and awareness: Secondary | ICD-10-CM | POA: Diagnosis present

## 2022-11-02 DIAGNOSIS — R1110 Vomiting, unspecified: Secondary | ICD-10-CM | POA: Diagnosis not present

## 2022-11-02 DIAGNOSIS — E860 Dehydration: Secondary | ICD-10-CM | POA: Diagnosis present

## 2022-11-02 DIAGNOSIS — Z885 Allergy status to narcotic agent status: Secondary | ICD-10-CM

## 2022-11-02 DIAGNOSIS — Y838 Other surgical procedures as the cause of abnormal reaction of the patient, or of later complication, without mention of misadventure at the time of the procedure: Secondary | ICD-10-CM | POA: Diagnosis not present

## 2022-11-02 DIAGNOSIS — E440 Moderate protein-calorie malnutrition: Secondary | ICD-10-CM | POA: Diagnosis present

## 2022-11-02 DIAGNOSIS — Y92009 Unspecified place in unspecified non-institutional (private) residence as the place of occurrence of the external cause: Secondary | ICD-10-CM | POA: Diagnosis not present

## 2022-11-02 DIAGNOSIS — S72001A Fracture of unspecified part of neck of right femur, initial encounter for closed fracture: Secondary | ICD-10-CM | POA: Diagnosis present

## 2022-11-02 DIAGNOSIS — G9341 Metabolic encephalopathy: Secondary | ICD-10-CM | POA: Diagnosis not present

## 2022-11-02 DIAGNOSIS — K219 Gastro-esophageal reflux disease without esophagitis: Secondary | ICD-10-CM | POA: Diagnosis present

## 2022-11-02 DIAGNOSIS — K567 Ileus, unspecified: Secondary | ICD-10-CM | POA: Diagnosis not present

## 2022-11-02 DIAGNOSIS — S72011A Unspecified intracapsular fracture of right femur, initial encounter for closed fracture: Principal | ICD-10-CM | POA: Diagnosis present

## 2022-11-02 DIAGNOSIS — T17418A Gastric contents in trachea causing other injury, initial encounter: Secondary | ICD-10-CM | POA: Diagnosis not present

## 2022-11-02 DIAGNOSIS — K921 Melena: Secondary | ICD-10-CM | POA: Diagnosis not present

## 2022-11-02 DIAGNOSIS — S8002XA Contusion of left knee, initial encounter: Secondary | ICD-10-CM | POA: Diagnosis present

## 2022-11-02 DIAGNOSIS — Z515 Encounter for palliative care: Secondary | ICD-10-CM | POA: Diagnosis not present

## 2022-11-02 DIAGNOSIS — I4901 Ventricular fibrillation: Secondary | ICD-10-CM | POA: Diagnosis not present

## 2022-11-02 DIAGNOSIS — E559 Vitamin D deficiency, unspecified: Secondary | ICD-10-CM | POA: Diagnosis present

## 2022-11-02 DIAGNOSIS — Z79899 Other long term (current) drug therapy: Secondary | ICD-10-CM

## 2022-11-02 DIAGNOSIS — Z888 Allergy status to other drugs, medicaments and biological substances status: Secondary | ICD-10-CM

## 2022-11-02 DIAGNOSIS — F0394 Unspecified dementia, unspecified severity, with anxiety: Secondary | ICD-10-CM | POA: Diagnosis not present

## 2022-11-02 DIAGNOSIS — R748 Abnormal levels of other serum enzymes: Secondary | ICD-10-CM | POA: Diagnosis not present

## 2022-11-02 DIAGNOSIS — F03918 Unspecified dementia, unspecified severity, with other behavioral disturbance: Secondary | ICD-10-CM | POA: Diagnosis present

## 2022-11-02 DIAGNOSIS — R6521 Severe sepsis with septic shock: Secondary | ICD-10-CM | POA: Diagnosis not present

## 2022-11-02 DIAGNOSIS — Z9181 History of falling: Secondary | ICD-10-CM

## 2022-11-02 DIAGNOSIS — J989 Respiratory disorder, unspecified: Secondary | ICD-10-CM | POA: Diagnosis not present

## 2022-11-02 DIAGNOSIS — I10 Essential (primary) hypertension: Secondary | ICD-10-CM | POA: Diagnosis present

## 2022-11-02 DIAGNOSIS — Z66 Do not resuscitate: Secondary | ICD-10-CM | POA: Diagnosis not present

## 2022-11-02 DIAGNOSIS — N179 Acute kidney failure, unspecified: Secondary | ICD-10-CM | POA: Diagnosis not present

## 2022-11-02 LAB — CBC WITH DIFFERENTIAL/PLATELET
Abs Immature Granulocytes: 0.02 10*3/uL (ref 0.00–0.07)
Basophils Absolute: 0 10*3/uL (ref 0.0–0.1)
Basophils Relative: 0 %
Eosinophils Absolute: 0.2 10*3/uL (ref 0.0–0.5)
Eosinophils Relative: 2 %
HCT: 43.7 % (ref 36.0–46.0)
Hemoglobin: 13.9 g/dL (ref 12.0–15.0)
Immature Granulocytes: 0 %
Lymphocytes Relative: 13 %
Lymphs Abs: 1.1 10*3/uL (ref 0.7–4.0)
MCH: 29.3 pg (ref 26.0–34.0)
MCHC: 31.8 g/dL (ref 30.0–36.0)
MCV: 92.2 fL (ref 80.0–100.0)
Monocytes Absolute: 0.8 10*3/uL (ref 0.1–1.0)
Monocytes Relative: 10 %
Neutro Abs: 6.2 10*3/uL (ref 1.7–7.7)
Neutrophils Relative %: 75 %
Platelets: 176 10*3/uL (ref 150–400)
RBC: 4.74 MIL/uL (ref 3.87–5.11)
RDW: 13.6 % (ref 11.5–15.5)
WBC: 8.3 10*3/uL (ref 4.0–10.5)
nRBC: 0 % (ref 0.0–0.2)

## 2022-11-02 LAB — URINALYSIS, ROUTINE W REFLEX MICROSCOPIC
Bilirubin Urine: NEGATIVE
Glucose, UA: NEGATIVE mg/dL
Ketones, ur: NEGATIVE mg/dL
Leukocytes,Ua: NEGATIVE
Nitrite: POSITIVE — AB
Protein, ur: NEGATIVE mg/dL
Specific Gravity, Urine: 1.01 (ref 1.005–1.030)
pH: 7 (ref 5.0–8.0)

## 2022-11-02 LAB — COMPREHENSIVE METABOLIC PANEL
ALT: 18 U/L (ref 0–44)
AST: 39 U/L (ref 15–41)
Albumin: 3.8 g/dL (ref 3.5–5.0)
Alkaline Phosphatase: 67 U/L (ref 38–126)
Anion gap: 10 (ref 5–15)
BUN: 10 mg/dL (ref 8–23)
CO2: 21 mmol/L — ABNORMAL LOW (ref 22–32)
Calcium: 9.5 mg/dL (ref 8.9–10.3)
Chloride: 106 mmol/L (ref 98–111)
Creatinine, Ser: 0.76 mg/dL (ref 0.44–1.00)
GFR, Estimated: >60 mL/min (ref >60)
Glucose, Bld: 91 mg/dL (ref 70–99)
Potassium: 4.1 mmol/L (ref 3.5–5.1)
Sodium: 137 mmol/L (ref 135–145)
Total Bilirubin: 2 mg/dL — ABNORMAL HIGH (ref 0.3–1.2)
Total Protein: 7.1 g/dL (ref 6.5–8.1)

## 2022-11-02 LAB — MAGNESIUM: Magnesium: 1.8 mg/dL (ref 1.7–2.4)

## 2022-11-02 LAB — CK: Total CK: 448 U/L — ABNORMAL HIGH (ref 38–234)

## 2022-11-02 LAB — TYPE AND SCREEN
ABO/RH(D): O POS
Antibody Screen: NEGATIVE

## 2022-11-02 LAB — CBG MONITORING, ED: Glucose-Capillary: 82 mg/dL (ref 70–99)

## 2022-11-02 LAB — ABO/RH: ABO/RH(D): O POS

## 2022-11-02 MED ORDER — ACETAMINOPHEN 650 MG RE SUPP
650.0000 mg | Freq: Four times a day (QID) | RECTAL | Status: DC | PRN
  Administered 2022-11-06: 650 mg via RECTAL
  Filled 2022-11-02: qty 1

## 2022-11-02 MED ORDER — NALOXONE HCL 0.4 MG/ML IJ SOLN: 0.4000 mg | INTRAMUSCULAR | Status: DC | PRN

## 2022-11-02 MED ORDER — FENTANYL CITRATE PF 50 MCG/ML IJ SOSY
25.0000 ug | PREFILLED_SYRINGE | INTRAMUSCULAR | Status: DC | PRN
  Administered 2022-11-04 – 2022-11-05 (×3): 25 ug via INTRAVENOUS
  Filled 2022-11-02 (×3): qty 1

## 2022-11-02 MED ORDER — PANTOPRAZOLE SODIUM 40 MG PO TBEC
40.0000 mg | DELAYED_RELEASE_TABLET | Freq: Every day | ORAL | Status: DC
  Administered 2022-11-05: 40 mg via ORAL
  Filled 2022-11-02: qty 1

## 2022-11-02 MED ORDER — ONDANSETRON HCL 4 MG/2ML IJ SOLN
4.0000 mg | Freq: Four times a day (QID) | INTRAMUSCULAR | Status: DC | PRN
  Administered 2022-11-03 – 2022-11-04 (×3): 4 mg via INTRAVENOUS
  Filled 2022-11-02 (×3): qty 2

## 2022-11-02 MED ORDER — LACTATED RINGERS IV SOLN: INTRAVENOUS | Status: DC

## 2022-11-02 MED ORDER — MELATONIN 3 MG PO TABS: 3.0000 mg | ORAL_TABLET | Freq: Every evening | ORAL | Status: DC | PRN

## 2022-11-02 MED ORDER — SODIUM CHLORIDE 0.9 % IV BOLUS
1000.0000 mL | Freq: Once | INTRAVENOUS | Status: AC
  Administered 2022-11-02: 1000 mL via INTRAVENOUS

## 2022-11-02 MED ORDER — ACETAMINOPHEN 325 MG PO TABS
650.0000 mg | ORAL_TABLET | Freq: Four times a day (QID) | ORAL | Status: DC | PRN
  Administered 2022-11-05: 650 mg via ORAL
  Filled 2022-11-02: qty 2

## 2022-11-02 NOTE — ED Triage Notes (Addendum)
Pt BIB EMS from home. Pt neighbor saw pt laying on the floor of front door and called EMS. Pt hx dementia, pt is currently living alone per neighbor d/t pt husband being in rehab. Swelling and bruising to left knee

## 2022-11-02 NOTE — H&P (Signed)
History and Physical      Eileen Nguyen:[id number] DOB: 10-11-1945 DOA: 11/02/2022 DOS: 11/02/22  PCP: Eileen Nguyen (will further assess) Patient coming from: home   I have personally briefly reviewed patient's old medical records in [facility]  Chief Complaint: fall  HPI: Eileen Nguyen is a 77 y.o. female with medical history significant for essential hypertension, GERD, who is admitted to [facility] on 11/02/2022 with right hip fracture after presenting from home to [facility] ED complaining of fall.   In the context of the patient's advanced dementia, the following history is provided by the patient as well as my discussions with the EDP and via chart review.  In the setting of the patient's advanced dementia, she typically resides at home with her husband.  However, the patient's husband is currently at rehab, leaving the patient at home by herself.  The patient's neighbor was checking on her earlier today, when they found the patient to be laying on the patient's porch, conscious, but in will will to extricate herself from the porch flooring as a consequence of her report of right hip discomfort.  Patient believes that she fell in order to arrive in such condition on the floor of her porch, but does not recall the specifics of this event.  She does not believe she hit her head but is unsure.  She reports current sharp right hip pain with radiation into the right groin.  Notes that the pain is constant, with exacerbation when attempting to move the right lower extremity.  As consequence of the associated intensity of this discomfort, she reports she was unable to bear weight on the right lower extremity at this time.  She also reports some mild left knee discomfort of unclear chronicity.  Otherwise, she denies any acute arthralgias or myalgias at this time.  Denies any acute numbness or paresthesias in the bilateral lower extremities.  The duration of time from the onset of fall  until the patient was found by her neighbor on the patient's porch is not entirely clear.  Patient denies any recent chest pain, shortness of breath, diaphoresis, palpitations, nausea, vomiting, dizziness.  She denies any current headache or neck pain, and nor any current blurry vision or diplopia.  It does not appear that she is on any blood thinners as an outpatient, including Nguyen aspirin.  Per chart review, Nguyen known history of CHF.      ED Course:  Vital signs in the ED were notable for the following: Afebrile; heart rate 77-88; systolic blood pressures in the 140s; respiratory rate 16-20; oxygen saturation 98 to 100% on room air.  Labs were notable for the following: CMP notable for the following: Sodium 137, potassium 4.1, creatinine 0.76, glucose 91, total bilirubin 2.0, otherwise liver enzymes within normal limits.  CPK 448.  CBC notable for white blood cell count 8300, hemoglobin 30.9, platelet count 176.  Urinalysis notable for Nguyen white blood cells, leukocyte esterase negative, moderate hemoglobin, and Nguyen RBCs.  Per my interpretation, EKG in ED demonstrated the following: The interpretation of presenting EKG was limited by the presence of artifact, but within these confines, appears to show sinus rhythm with heart rate 78, normal intervals, Nguyen evidence of T wave or ST changes, including Nguyen evidence of ST elevation.  Imaging and additional notable ED work-up: 1 view chest x-ray, performed radiology read, shows Nguyen evidence of acute cardiopulmonary process, including evidence of infiltrate, edema, effusion, or pneumothorax.  CT head, performed  radiology read, shows Nguyen evidence of acute intracranial process, including evidence of intracranial hemorrhage or any evidence of acute infarct.  CT cervical spine, performed radiology read, shows Nguyen evidence of acute cervical spine fracture or subluxation injury.  Plain films of the left knee, 4 views, per formal radiology read, shows Nguyen evidence of acute  fracture or dislocation.  CT pelvis without contrast, performed radiology read, shows impacted fracture in the subcapital portion of the neck of the right femur, without evidence of dislocation.  EDP discussed the patient's case and imaging with the on-call orthopedic surgeon for [facility], Dr. Duwayne Heck, who recommended admission to the hospitalist service for further evaluation and management of acute right  hip fracture.  Dr. Aundria Rud conveyed that Eileen Nguyen will formally consult and see the patient in the AM (11/03/22), but will likely not take patient to the OR until Friday, 11/04/22.  Consequently, Dr.Rogers conveyed that it is okay to allow the patient to eat overnight.  While in the ED, the following were administered: Normal saline x 1 L bolus.  Subsequently, the patient was admitted for further evaluation and management of presenting right hip fracture in setting of suspected mechanical ground-level fall, with presenting labs notable for mild elevation in CPK.     Review of Systems: As per HPI otherwise 10 point review of systems negative.   Past Medical History:  Diagnosis Date   Acid reflux    Degenerative disc disease, lumbar    Hiatal hernia with GERD    Hypertension    Psoriasis     History reviewed. Nguyen pertinent surgical history.  Social History:  reports that she has never smoked. She has never used smokeless tobacco. She reports that she does not drink alcohol and does not use drugs.   Allergies  Allergen Reactions   Beta Adrenergic Blockers Rash and Other (See Comments)    Worsens psoriasis   Codeine Nausea And Vomiting   Amlodipine Nausea Only and Other (See Comments)    Also caused dizziness and had Nguyen appetite   Other Other (See Comments)    Per a [facility] doctor, patient was told to "never use any inhalers" (was told it would worsen her psoriasis)    History reviewed. Nguyen pertinent family history.  Family history reviewed and not pertinent    Prior to  Admission medications   Medication Sig Start Date End Date Taking? Authorizing Provider  cephALEXin (KEFLEX) 500 MG capsule Take 1 capsule (500 mg total) by mouth 3 (three) times daily. 07/29/20   Geoffery Lyons, MD  chlorthalidone (HYGROTON) 25 MG tablet Take 25 mg by mouth daily.    [provider]  lidocaine (XYLOCAINE) 2 % solution Use as directed 15 mLs in the mouth or throat every 3 (three) hours as needed for mouth pain. Patient not taking: Reported on 11/10/2018 08/09/17   Frederik Pear A, PA-C  meclizine (ANTIVERT) 12.5 MG tablet Take 12.5 mg by mouth daily as needed for dizziness. 09/26/18   [provider]  omeprazole (PRILOSEC) 20 MG capsule Take 1 capsule (20 mg total) by mouth 2 (two) times daily. 08/09/17 11/10/18  McDonald, Mia A, PA-C  ondansetron (ZOFRAN ODT) 8 MG disintegrating tablet Take 1 tablet (8 mg total) by mouth every 8 (eight) hours as needed for nausea or vomiting. Patient not taking: Reported on 11/10/2018 12/29/17   Azalia Bilis, MD  potassium chloride SA (K-DUR,KLOR-CON) 20 MEQ tablet Take 1 tablet (20 mEq total) by mouth daily. Patient taking differently: Take 20 mEq  by mouth 2 (two) times daily.  03/28/15   Mancel Bale, MD  triamcinolone (KENALOG) 0.025 % cream Apply fingertip amount to affected areas of feet twice daily. Patient not taking: Reported on 11/10/2018 08/16/18   Park Liter, DPM     Objective    Physical Exam: Vitals:   11/02/22 1515 11/02/22 1516 11/02/22 1715 11/02/22 1900  BP: (!) 141/62 (!) 141/62 (!) 149/78   Pulse:  79 88   Resp:  20 18   Temp:    97.8 F (36.6 C)  TempSrc:      SpO2: 98% 98% 100%   Weight:      Height:        General: appears to be stated age; oriented to self Skin: warm, dry, Nguyen rash Head:  AT/[place] Mouth:  Oral mucosa membranes appear dry, normal dentition Neck: supple; trachea midline Heart:  RRR; did not appreciate any M/R/G Lungs: CTAB, did not appreciate any wheezes, rales, or rhonchi Abdomen:  + BS; soft, ND, NT Vascular: 2+ pedal pulses b/l; 2+ radial pulses b/l Extremities: Nguyen peripheral edema, Nguyen muscle wasting Neuro: sensation intact in upper and lower extremities b/l; deferred evaluation of strength in the right lower extremity the context of presenting acute right hip fracture    Labs on Admission: I have personally reviewed following labs and imaging studies  CBC: Recent Labs  Lab 11/02/22 1634  WBC 8.3  NEUTROABS 6.2  HGB 13.9  HCT 43.7  MCV 92.2  PLT 176   Basic Metabolic Panel: Recent Labs  Lab 11/02/22 1634  NA 137  K 4.1  CL 106  CO2 21*  GLUCOSE 91  BUN 10  CREATININE 0.76  CALCIUM 9.5   GFR: Estimated Creatinine Clearance: 53.4 mL/min (by C-G formula based on SCr of 0.76 mg/dL). Liver Function Tests: Recent Labs  Lab 11/02/22 1634  AST 39  ALT 18  ALKPHOS 67  BILITOT 2.0*  PROT 7.1  ALBUMIN 3.8   Nguyen results for input(s): "LIPASE", "AMYLASE" in the last 168 hours. Nguyen results for input(s): "AMMONIA" in the last 168 hours. Coagulation Profile: Nguyen results for input(s): "INR", "PROTIME" in the last 168 hours. Cardiac Enzymes: Recent Labs  Lab 11/02/22 1634  CKTOTAL 448*   BNP (last 3 results) Nguyen results for input(s): "PROBNP" in the last 8760 hours. HbA1C: Nguyen results for input(s): "HGBA1C" in the last 72 hours. CBG: Recent Labs  Lab 11/02/22 1614  GLUCAP 82   Lipid Profile: Nguyen results for input(s): "CHOL", "HDL", "LDLCALC", "TRIG", "CHOLHDL", "LDLDIRECT" in the last 72 hours. Thyroid Function Tests: Nguyen results for input(s): "TSH", "T4TOTAL", "FREET4", "T3FREE", "THYROIDAB" in the last 72 hours. Anemia Panel: Nguyen results for input(s): "VITAMINB12", "FOLATE", "FERRITIN", "TIBC", "IRON", "RETICCTPCT" in the last 72 hours. Urine analysis:    Component Value Date/Time   COLORURINE YELLOW 11/02/2022 1634   APPEARANCEUR HAZY (A) 11/02/2022 1634   LABSPEC 1.010 11/02/2022 1634   PHURINE 7.0 11/02/2022 1634   GLUCOSEU NEGATIVE  11/02/2022 1634   HGBUR MODERATE (A) 11/02/2022 1634   BILIRUBINUR NEGATIVE 11/02/2022 1634   KETONESUR NEGATIVE 11/02/2022 1634   PROTEINUR NEGATIVE 11/02/2022 1634   UROBILINOGEN 0.2 03/27/2015 1940   NITRITE POSITIVE (A) 11/02/2022 1634   LEUKOCYTESUR NEGATIVE 11/02/2022 1634    Radiological Exams on Admission: CT PELVIS WO CONTRAST  Result Date: 11/02/2022 CLINICAL DATA:  Trauma, fall EXAM: CT PELVIS WITHOUT CONTRAST TECHNIQUE: Multidetector CT imaging of the pelvis was performed following the standard protocol without intravenous contrast. RADIATION  DOSE REDUCTION: This exam was performed according to the departmental dose-optimization program which includes automated exposure control, adjustment of the mA and/or kV according to patient size and/or use of iterative reconstruction technique. COMPARISON:  CT abdomen and pelvis done on 07/29/2020 FINDINGS: Urinary Tract: Diverticulum is noted in the left lateral margin of the urinary bladder with Nguyen significant change. Bowel:  Visualized bowel loops are unremarkable. Vascular/Lymphatic: Scattered arterial calcifications are seen. Reproductive:  Unremarkable. Other:  There is Nguyen ascites or pneumoperitoneum in pelvis. Musculoskeletal: There is recent impacted fracture in subcapital portion of neck of right femur. There is Nguyen dislocation. Degenerative changes with bony spurs are noted in left hip. SI joints are symmetrical. Degenerative changes are noted in the pubic symphysis with bony spurs. Degenerative changes are noted in the visualized lower lumbar spine with bony spurs and facet hypertrophy. IMPRESSION: Recent impacted fracture is seen in subcapital portion of neck of right femur. There is Nguyen dislocation. Degenerative changes are noted in left hip and lumbar spine. Electronically Signed   By: Ernie Avena M.D.   On: 11/02/2022 18:20   DG Pelvis Portable  Result Date: 11/02/2022 CLINICAL DATA:  Found on floor with swelling and bruising to  the left knee. EXAM: PORTABLE PELVIS 1-2 VIEWS COMPARISON:  CT abdomen and pelvis 07/29/2020 FINDINGS: Widening of the right SI joint. Question of acute fracture of the right iliac wing versus projection artifact. Additional cortical irregularity and subtle lucency through the right femoral head neck junction suspicious for minimally displaced impacted fracture. IMPRESSION: 1. Widening of the right SI joint with questionable right iliac wing fracture. 2. Possible additional minimally displaced and impacted right subcapital femoral neck fracture. 3. CT is recommended to further evaluate these findings. Electronically Signed   By: Minerva Fester M.D.   On: 11/02/2022 17:40   DG Knee Complete 4 Views Left  Result Date: 11/02/2022 CLINICAL DATA:  Fall and left knee pain. EXAM: LEFT KNEE - COMPLETE 4+ VIEW COMPARISON:  None Available. FINDINGS: There is Nguyen acute fracture or dislocation. The bones are osteopenic. There is arthritic change of the knee with tricompartmental narrowing most severely involving the lateral compartment. Small suprapatellar effusion. The soft tissues are unremarkable. IMPRESSION: 1. Nguyen acute fracture or dislocation. 2. Osteopenia with osteoarthritis. Electronically Signed   By: Elgie Collard M.D.   On: 11/02/2022 17:35   DG Chest Port 1 View  Result Date: 11/02/2022 CLINICAL DATA:  Neighbor found patient lying on the floor. Swelling and bruising to left knee. Fall. EXAM: PORTABLE CHEST 1 VIEW COMPARISON:  05/15/2018 FINDINGS: Limited evaluation of the cardiomediastinal silhouette given patient rotation however this appears grossly similar to 05/15/2018. Aortic atherosclerotic calcification. Nguyen focal consolidation, pleural effusion, or pneumothorax. Nguyen displaced rib fractures. Breast implants. IMPRESSION: Nguyen acute cardiopulmonary disease. Electronically Signed   By: Minerva Fester M.D.   On: 11/02/2022 17:35   CT Cervical Spine Wo Contrast  Result Date: 11/02/2022 CLINICAL DATA:   Neck trauma.  Found on floor. EXAM: CT CERVICAL SPINE WITHOUT CONTRAST TECHNIQUE: Multidetector CT imaging of the cervical spine was performed without intravenous contrast. Multiplanar CT image reconstructions were also generated. RADIATION DOSE REDUCTION: This exam was performed according to the departmental dose-optimization program which includes automated exposure control, adjustment of the mA and/or kV according to patient size and/or use of iterative reconstruction technique. COMPARISON:  Cervical spine CT 07/29/2020 FINDINGS: Alignment: Unchanged trace anterolisthesis of C5 on C6. Skull base and vertebrae: Nguyen acute fracture or suspicious osseous lesion. Soft tissues  and spinal canal: Nguyen prevertebral fluid or swelling. Nguyen visible canal hematoma. Disc levels: Similar appearance of multilevel disc degeneration, moderate at C4-5. Upper chest: Nguyen mass or consolidation in the included lung apices. Other: 1.3 cm right thyroid nodule for which Nguyen follow-up imaging is recommended. Mild calcific atherosclerosis at the carotid bifurcations. IMPRESSION: Nguyen acute cervical spine fracture or traumatic malalignment. Electronically Signed   By: Sebastian Ache M.D.   On: 11/02/2022 16:33   CT Head Wo Contrast  Result Date: 11/02/2022 CLINICAL DATA:  Trauma EXAM: CT HEAD WITHOUT CONTRAST TECHNIQUE: Contiguous axial images were obtained from the base of the skull through the vertex without intravenous contrast. RADIATION DOSE REDUCTION: This exam was performed according to the departmental dose-optimization program which includes automated exposure control, adjustment of the mA and/or kV according to patient size and/or use of iterative reconstruction technique. COMPARISON:  Head CT 07/29/2020.  MRI head 02/18/2013. FINDINGS: Brain: Nguyen evidence of acute infarction, hemorrhage, hydrocephalus, or extra-axial collection. Calcified extra-axial rounded mass in the right parietal region measures 12 mm and appears unchanged previously  characterized as meningioma. Cerebellar atrophy again noted. Vascular: Atherosclerotic calcifications are present within the cavernous internal carotid arteries. Skull: Normal. Negative for fracture or focal lesion. Sinuses/Orbits: There is opacification of the right maxillary sinus. Orbits are within normal limits. Other: None. IMPRESSION: 1. Nguyen acute intracranial process. 2. Stable calcified extra-axial mass in the right parietal region, previously characterized as meningioma. 3. Right maxillary sinus disease. Electronically Signed   By: Darliss Cheney M.D.   On: 11/02/2022 16:28      Assessment/Plan    Principal Problem:   Closed right hip fracture (HCC) Active Problems:   GERD (gastroesophageal reflux disease)   Essential hypertension   Fall at home, initial encounter   Elevated CPK     #) Acute right hip fracture: confirmed via presenting CT pelvis and stemming from suspected, mechanical fall without overt loss of consciousness that appeared to have occurred earlier on the day of admission, as further described above, with the patient reporting current right hip pain, all other historical details relating to her right hip fracture are complicated by the unwitnessed nature of this fall as well as the patient's advanced dementia, as further detailed above.  at this time, the right lower extremity appears neurovascularly intact, and patient reports adequate pain control.  Does not appear to be on any blood thinners at home, including Nguyen aspirin.   EDP discussed the patient's case and imaging with the on-call orthopedic surgeon for [facility], Dr. Duwayne Heck, who recommended admission to the hospitalist service for further evaluation and management of acute right  hip fracture.  Dr. Aundria Rud conveyed that Eileen Nguyen will formally consult and see the patient in the AM (11/03/22), but will likely not take patient to the OR until Friday, 11/04/22.  Consequently, Dr.Rogers conveyed that it is okay to  allow the patient to eat overnight.  Chales Abrahams Score for this patient in the context of anticipated aforementioned orthopedic surgery conveys a  0.37% perioperative risk for significant cardiac event. Nguyen evidence to suggest acutely decompensated heart failure or acute MI. Consequently, Nguyen absolute contraindications to proceeding with proposed orthopedic surgery at this time.   Plan: Formal orthopedic surgery consult for definitive surgical management, with plan to take patient to the OR on Friday, 11/04/22. Nguyen pharmacologic anticoagulation leading up to this anticipated surgery. SCD's. Prn IV fentanyl. Anticipate postoperative PT consult. Check INR. Check 25-hydroxy vit D level to assess for any underlying pathological  contribution towards the patient's fracture stemming from associated vitamin deficiency. Type and screen ordered.          #) Fall: The patient presents for evaluation of fall on her porch earlier today, which appears to be ground-level mechanical in nature, as further detailed above, although some details of this mechanism are not entirely clear in the context of the unwitnessed nature of the patient's fall as well as her advanced dementia and associated limited ability to convey elements of history as a result.   Within these limitations, her fall appears to be purely mechanical in nature, without clinical evidence at this time to suggest contributory dizziness, presyncope, syncope, or acute ischemic CVA. Does not appear to have hit head as component of this fall. presenting CT head showed Nguyen evidence of acute intracranial process, including Nguyen evidence of intracranial hemorrhage, and presentation does not appear to be associated any acute neurologic deficits.  Nguyen evidence of underlying contributory infection predisposing her to fall, including urinalysis that is inconsistent with UTI, will chest x-ray shows Nguyen evidence of pneumonia.   Plan:  CMP and CBC with differential in the  morning. Fall precautions. Anticipate postoperative PT consult.             #) Elevated CPK level: Mildly elevated CPK level of 448, which does not quantitatively meet threshold for rhabdomyolysis.  However, she is at risk for rhabdomyolysis given unclear downtime after presenting fall before the patient was found by her neighbor on the floor of porch, will also noting that presenting urinalysis shows moderate hemoglobin but in the absence of any RBCs, increased risk for the presence of myoglobinuria.  Consequently, will initiate gentle IV fluids, and further trend CPK level, as further described below.  Of note, she is not on any statin medications at home.  Plan: Lactated Ringer's at 75 cc/h x 10 hours.  Monitor strict I's and O's and daily weights.  Repeat CPK level in the morning.  CMP in the morning.              #) Essential Hypertension: documented h/o such, with outpatient antihypertensive regimen including chlorthalidone.  SBP's in the ED today: 140s mmHg. in the setting of presenting blood pressures, and clinical evidence of mild dehydration in the context of period of downtime outside in the warm ambient temperatures, will hold home chlorthalidone for now.  Plan: Close monitoring of subsequent BP via routine VS. hold home chlorthalidone for now, as above.              #) GERD: documented h/o such; on omeprazole as outpatient.   Plan: continue home PPI.         DVT prophylaxis: SCD's   Code Status: Full code (presumed in the context of the patient's advanced dementia and Nguyen prior CODE STATUS documentation available per initial chart review) Family Communication: none Disposition Plan: Per Rounding Team Consults called: EDP discussed patient's case and imaging with the on-call [facility] physician, Dr. Duwayne Heck, As further detailed above;  Admission status: Inpatient    I SPENT GREATER THAN 75  MINUTES IN CLINICAL CARE TIME/MEDICAL  DECISION-MAKING IN COMPLETING THIS ADMISSION.     Chaney Born Ceira Hoeschen DO [facility] From 7PM - 7AM   11/02/2022, 7:49 PM

## 2022-11-02 NOTE — ED Provider Notes (Signed)
EMERGENCY DEPARTMENT AT [facility] Provider Note   CSN: [id number] Arrival date & time: 11/02/22  1505     History  Chief Complaint  Patient presents with   Eileen Nguyen is a 77 y.o. female.  Patient is a 77 year old female with a past medical history of dementia, hypertension and GERD presenting to the emergency department after being found down.  Per EMS, the patient is currently living at home alone while her husband is at rehab and her neighbor found her on the porch on the ground this morning.  Patient is unable to provide any additional history due to her dementia and is continually stating "I need to get home as I have 2 infants and a 13-year-old".  He denies of any pain.  Per the neighbor her husband is her only family.  The history is provided by the EMS personnel. History limited by: Level 5 caveat for dementia.  Fall       Home Medications Prior to Admission medications   Medication Sig Start Date End Date Taking? Authorizing Provider  cephALEXin (KEFLEX) 500 MG capsule Take 1 capsule (500 mg total) by mouth 3 (three) times daily. Patient not taking: Reported on 11/02/2022 07/29/20   Geoffery Lyons, MD  lidocaine (XYLOCAINE) 2 % solution Use as directed 15 mLs in the mouth or throat every 3 (three) hours as needed for mouth pain. Patient not taking: Reported on 11/02/2022 08/09/17   Frederik Pear A, PA-C  omeprazole (PRILOSEC) 20 MG capsule Take 1 capsule (20 mg total) by mouth 2 (two) times daily. Patient not taking: Reported on 11/02/2022 08/09/17 11/02/22  McDonald, Mia A, PA-C  ondansetron (ZOFRAN ODT) 8 MG disintegrating tablet Take 1 tablet (8 mg total) by mouth every 8 (eight) hours as needed for nausea or vomiting. Patient not taking: Reported on 11/02/2022 12/29/17   Azalia Bilis, MD  potassium chloride SA (K-DUR,KLOR-CON) 20 MEQ tablet Take 1 tablet (20 mEq total) by mouth daily. Patient not taking: Reported on 11/02/2022 03/28/15   Mancel Bale, MD  triamcinolone (KENALOG) 0.025 % cream Apply fingertip amount to affected areas of feet twice daily. Patient not taking: Reported on 11/02/2022 08/16/18   Park Liter, DPM      Allergies    Beta adrenergic blockers, Codeine, Amlodipine, and Other    Review of Systems   Review of Systems  Physical Exam Updated Vital Signs BP (!) 164/79   Pulse 85   Temp 98.2 F (36.8 C) (Oral)   Resp 17   Ht 5\' 2"  (1.575 m)   Wt 48.7 kg   SpO2 98%   BMI 19.64 kg/m  Physical Exam Vitals and nursing note reviewed.  Constitutional:      General: She is not in acute distress.    Appearance: Normal appearance.  HENT:     Head: Normocephalic and atraumatic.     Nose: Nose normal.     Mouth/Throat:     Mouth: Mucous membranes are moist.     Pharynx: Oropharynx is clear.  Eyes:     Extraocular Movements: Extraocular movements intact.     Conjunctiva/sclera: Conjunctivae normal.     Pupils: Pupils are equal, round, and reactive to light.  Neck:     Comments: No midline neck tenderness Cardiovascular:     Rate and Rhythm: Normal rate and regular rhythm.     Heart sounds: Normal heart sounds.  Pulmonary:     Effort: Pulmonary effort is  normal.     Breath sounds: Normal breath sounds.  Abdominal:     General: Abdomen is flat.     Palpations: Abdomen is soft.     Tenderness: There is no abdominal tenderness.  Musculoskeletal:        General: Normal range of motion.     Cervical back: Normal range of motion and neck supple.     Comments: No midline back tenderness Pelvis stable, nontender No bony tenderness to bilateral upper or lower extremities   Skin:    General: Skin is warm and dry.     Findings: Bruising (Left medial knee) present.  Neurological:     General: No focal deficit present.     Mental Status: She is alert.     Cranial Nerves: No cranial nerve deficit.     Sensory: No sensory deficit.     Motor: No weakness.     Comments: Oriented to person only,  following commands  Psychiatric:        Mood and Affect: Mood normal.        Behavior: Behavior normal.     ED Results / Procedures / Treatments   Labs (all labs ordered are listed, but only abnormal results are displayed) Labs Reviewed  COMPREHENSIVE METABOLIC PANEL - Abnormal; Notable for the following components:      Result Value   CO2 21 (*)    Total Bilirubin 2.0 (*)    All other components within normal limits  CK - Abnormal; Notable for the following components:   Total CK 448 (*)    All other components within normal limits  URINALYSIS, ROUTINE W REFLEX MICROSCOPIC - Abnormal; Notable for the following components:   APPearance HAZY (*)    Hgb urine dipstick MODERATE (*)    Nitrite POSITIVE (*)    Bacteria, UA FEW (*)    All other components within normal limits  URINE CULTURE  CBC WITH DIFFERENTIAL/PLATELET  MAGNESIUM  CBC WITH DIFFERENTIAL/PLATELET  COMPREHENSIVE METABOLIC PANEL  MAGNESIUM  PROTIME-INR  VITAMIN D 25 HYDROXY (VIT D DEFICIENCY, FRACTURES)  CK  CBG MONITORING, ED  TYPE AND SCREEN  ABO/RH    EKG EKG Interpretation  Date/Time:  Wednesday Nov 02 2022 15:16:13 EDT Ventricular Rate:  78 PR Interval:  112 QRS Duration: 103 QT Interval:  394 QTC Calculation: 449 R Axis:   15 Text Interpretation: Sinus rhythm Borderline short PR interval Anterior infarct, old No significant change since last tracing Confirmed by Elayne Snare ([id number]) on 11/02/2022 4:14:05 PM  Radiology CT PELVIS WO CONTRAST  Result Date: 11/02/2022 CLINICAL DATA:  Trauma, fall EXAM: CT PELVIS WITHOUT CONTRAST TECHNIQUE: Multidetector CT imaging of the pelvis was performed following the standard protocol without intravenous contrast. RADIATION DOSE REDUCTION: This exam was performed according to the departmental dose-optimization program which includes automated exposure control, adjustment of the mA and/or kV according to patient size and/or use of iterative reconstruction  technique. COMPARISON:  CT abdomen and pelvis done on 07/29/2020 FINDINGS: Urinary Tract: Diverticulum is noted in the left lateral margin of the urinary bladder with no significant change. Bowel:  Visualized bowel loops are unremarkable. Vascular/Lymphatic: Scattered arterial calcifications are seen. Reproductive:  Unremarkable. Other:  There is no ascites or pneumoperitoneum in pelvis. Musculoskeletal: There is recent impacted fracture in subcapital portion of neck of right femur. There is no dislocation. Degenerative changes with bony spurs are noted in left hip. SI joints are symmetrical. Degenerative changes are noted in the pubic symphysis with bony spurs.  Degenerative changes are noted in the visualized lower lumbar spine with bony spurs and facet hypertrophy. IMPRESSION: Recent impacted fracture is seen in subcapital portion of neck of right femur. There is no dislocation. Degenerative changes are noted in left hip and lumbar spine. Electronically Signed   By: Ernie Avena M.D.   On: 11/02/2022 18:20   DG Pelvis Portable  Result Date: 11/02/2022 CLINICAL DATA:  Found on floor with swelling and bruising to the left knee. EXAM: PORTABLE PELVIS 1-2 VIEWS COMPARISON:  CT abdomen and pelvis 07/29/2020 FINDINGS: Widening of the right SI joint. Question of acute fracture of the right iliac wing versus projection artifact. Additional cortical irregularity and subtle lucency through the right femoral head neck junction suspicious for minimally displaced impacted fracture. IMPRESSION: 1. Widening of the right SI joint with questionable right iliac wing fracture. 2. Possible additional minimally displaced and impacted right subcapital femoral neck fracture. 3. CT is recommended to further evaluate these findings. Electronically Signed   By: Minerva Fester M.D.   On: 11/02/2022 17:40   DG Knee Complete 4 Views Left  Result Date: 11/02/2022 CLINICAL DATA:  Fall and left knee pain. EXAM: LEFT KNEE - COMPLETE  4+ VIEW COMPARISON:  None Available. FINDINGS: There is no acute fracture or dislocation. The bones are osteopenic. There is arthritic change of the knee with tricompartmental narrowing most severely involving the lateral compartment. Small suprapatellar effusion. The soft tissues are unremarkable. IMPRESSION: 1. No acute fracture or dislocation. 2. Osteopenia with osteoarthritis. Electronically Signed   By: Elgie Collard M.D.   On: 11/02/2022 17:35   DG Chest Port 1 View  Result Date: 11/02/2022 CLINICAL DATA:  Neighbor found patient lying on the floor. Swelling and bruising to left knee. Fall. EXAM: PORTABLE CHEST 1 VIEW COMPARISON:  05/15/2018 FINDINGS: Limited evaluation of the cardiomediastinal silhouette given patient rotation however this appears grossly similar to 05/15/2018. Aortic atherosclerotic calcification. No focal consolidation, pleural effusion, or pneumothorax. No displaced rib fractures. Breast implants. IMPRESSION: No acute cardiopulmonary disease. Electronically Signed   By: Minerva Fester M.D.   On: 11/02/2022 17:35   CT Cervical Spine Wo Contrast  Result Date: 11/02/2022 CLINICAL DATA:  Neck trauma.  Found on floor. EXAM: CT CERVICAL SPINE WITHOUT CONTRAST TECHNIQUE: Multidetector CT imaging of the cervical spine was performed without intravenous contrast. Multiplanar CT image reconstructions were also generated. RADIATION DOSE REDUCTION: This exam was performed according to the departmental dose-optimization program which includes automated exposure control, adjustment of the mA and/or kV according to patient size and/or use of iterative reconstruction technique. COMPARISON:  Cervical spine CT 07/29/2020 FINDINGS: Alignment: Unchanged trace anterolisthesis of C5 on C6. Skull base and vertebrae: No acute fracture or suspicious osseous lesion. Soft tissues and spinal canal: No prevertebral fluid or swelling. No visible canal hematoma. Disc levels: Similar appearance of multilevel disc  degeneration, moderate at C4-5. Upper chest: No mass or consolidation in the included lung apices. Other: 1.3 cm right thyroid nodule for which no follow-up imaging is recommended. Mild calcific atherosclerosis at the carotid bifurcations. IMPRESSION: No acute cervical spine fracture or traumatic malalignment. Electronically Signed   By: Sebastian Ache M.D.   On: 11/02/2022 16:33   CT Head Wo Contrast  Result Date: 11/02/2022 CLINICAL DATA:  Trauma EXAM: CT HEAD WITHOUT CONTRAST TECHNIQUE: Contiguous axial images were obtained from the base of the skull through the vertex without intravenous contrast. RADIATION DOSE REDUCTION: This exam was performed according to the departmental dose-optimization program which includes automated  exposure control, adjustment of the mA and/or kV according to patient size and/or use of iterative reconstruction technique. COMPARISON:  Head CT 07/29/2020.  MRI head 02/18/2013. FINDINGS: Brain: No evidence of acute infarction, hemorrhage, hydrocephalus, or extra-axial collection. Calcified extra-axial rounded mass in the right parietal region measures 12 mm and appears unchanged previously characterized as meningioma. Cerebellar atrophy again noted. Vascular: Atherosclerotic calcifications are present within the cavernous internal carotid arteries. Skull: Normal. Negative for fracture or focal lesion. Sinuses/Orbits: There is opacification of the right maxillary sinus. Orbits are within normal limits. Other: None. IMPRESSION: 1. No acute intracranial process. 2. Stable calcified extra-axial mass in the right parietal region, previously characterized as meningioma. 3. Right maxillary sinus disease. Electronically Signed   By: Darliss Cheney M.D.   On: 11/02/2022 16:28    Procedures Procedures    Medications Ordered in ED Medications  acetaminophen (TYLENOL) tablet 650 mg (has no administration in time range)    Or  acetaminophen (TYLENOL) suppository 650 mg (has no  administration in time range)  melatonin tablet 3 mg (has no administration in time range)  ondansetron (ZOFRAN) injection 4 mg (has no administration in time range)  naloxone [facility]) injection 0.4 mg (has no administration in time range)  fentaNYL (SUBLIMAZE) injection 25 mcg (has no administration in time range)  lactated ringers infusion (has no administration in time range)  pantoprazole (PROTONIX) EC tablet 40 mg (has no administration in time range)  sodium chloride 0.9 % bolus 1,000 mL (0 mLs Intravenous Stopped 11/02/22 1931)    ED Course/ Medical Decision Making/ A&P Clinical Course as of 11/02/22 2339  Wed Nov 02, 2022  1747 Pelvis XR with possible R iliac crest and R hip fracture. She will have CT to further evaluate. CK 448 with normal Cr making rhabdo less likely. She does have nitrites in the urine but few bacteria and no leuks. Urine culture ordered and no antibiotics started at this time. [VK]  1759 I attempted to call number for husband in the chart and was unable to leave a message and did not have an answer. [VK]  1824 CT with impacted fx at R femoral neck. Orthopedics will be consulted. [VK]  1908 I spoke with Dr. Aundria Rud of orthopedics who states likely will require OR on Friday, Dr. Rayburn Ma will see tomorrow. Ok to eat tonight. Hospitalist will be called for admission. [VK]    Clinical Course User Index [VK] Rexford Maus, DO                             Medical Decision Making This patient presents to the ED with chief complaint(s) of fall with pertinent past medical history of dementia, HTN, GERD which further complicates the presenting complaint. The complaint involves an extensive differential diagnosis and also carries with it a high risk of complications and morbidity.    The differential diagnosis includes ICH, mass effect, cervical spine injury, pelvis fracture, knee fracture, no other traumatic injuries seen on exam, rhabdo, dehydration, infection,  electrolyte abnormality  Additional history obtained: Additional history obtained from EMS  Records reviewed Care Everywhere/External Records  ED Course and Reassessment: On patient's arrival she is hemodynamically stable in no acute distress.  The patient does have bruising to her left knee and no other signs of trauma on her exam however due to her age and limited history she will have CT head, C-spine, chest x-ray, pelvis x-ray and left knee x-ray to  evaluate for acute traumatic injury.  She will additionally have labs including CK to evaluate for causes of her fall.  EKG was performed on arrival that showed normal sinus rhythm without acute ischemic changes.  Patient will be closely reassessed.  Independent labs interpretation:  The following labs were independently interpreted: mildly elevated CK, otherwise within normal range  Independent visualization of imaging: - I independently visualized the following imaging with scope of interpretation limited to determining acute life threatening conditions related to emergency care: CTH/C-spine, CXR, pelvis XR/CT, which revealed R hip fx  Consultation: - Consulted or discussed management/test interpretation w/ external professional: orthopedics, hospitalist  Consideration for admission or further workup: patient requires admission for her hip fx Social Determinants of health: N/A    Amount and/or Complexity of Data Reviewed Labs: ordered. Radiology: ordered.  Risk Decision regarding hospitalization.           Final Clinical Impression(s) / ED Diagnoses Final diagnoses:  None    Rx / DC Orders ED Discharge Orders     None         Rexford Maus, DO 11/02/22 2339

## 2022-11-02 NOTE — ED Notes (Signed)
ED TO INPATIENT HANDOFF REPORT  ED Nurse Name and Phone #:  Latricia Heft Name/Age/Gender Eileen Nguyen 77 y.o. female Room/Bed: WA16/WA16  Code Status   Code Status: Full Code  Home/SNF/Other   Patient oriented to: self Is this baseline? Yes   Triage Complete: Triage complete  Chief Complaint Closed right hip fracture (HCC) [S72.001A]  Triage Note Pt BIB EMS from home. Pt neighbor saw pt laying on the floor of front door and called EMS. Pt hx dementia, pt is currently living alone per neighbor d/t pt husband being in rehab. Swelling and bruising to left knee    Allergies Allergies  Allergen Reactions   Beta Adrenergic Blockers Rash and Other (See Comments)    Worsens psoriasis   Codeine Nausea And Vomiting   Amlodipine Nausea Only and Other (See Comments)    Also caused dizziness and had no appetite   Other Other (See Comments)    Per a [facility] doctor, patient was told to "never use any inhalers" (was told it would worsen her psoriasis)    Level of Care/Admitting Diagnosis ED Disposition     ED Disposition  Admit   Condition  --   Comment  Hospital Area: [facility] [[id number]]  Level of Care: Med-Surg [16]  May admit patient to [facility] or [facility] if equivalent level of care is available:: No  Covid Evaluation: Asymptomatic - no recent exposure (last 10 days) testing not required  Diagnosis: Closed right hip fracture [facility]) [[id number]]  Admitting Physician: Angie Fava [[id number]]  Attending Physician: Angie Fava [[id number]]  Certification:: I certify this patient will need inpatient services for at least 2 midnights  Estimated Length of Stay: 2          B Medical/Surgery History Past Medical History:  Diagnosis Date   Acid reflux    Degenerative disc disease, lumbar    Hiatal hernia with GERD    Hypertension    Psoriasis    History reviewed. No pertinent surgical history.   A IV Location/Drains/Wounds Patient  Lines/Drains/Airways Status     Active Line/Drains/Airways     Name Placement date Placement time Site Days   Peripheral IV 11/02/22 22 G Left Antecubital 11/02/22  1820  Antecubital  less than 1            Intake/Output Last 24 hours No intake or output data in the 24 hours ending 11/02/22 1941  Labs/Imaging Results for orders placed or performed during the hospital encounter of 11/02/22 (from the past 48 hour(s))  CBG monitoring, ED     Status: None   Collection Time: 11/02/22  4:14 PM  Result Value Ref Range   Glucose-Capillary 82 70 - 99 mg/dL    Comment: Glucose reference range applies only to samples taken after fasting for at least 8 hours.  CBC with Differential     Status: None   Collection Time: 11/02/22  4:34 PM  Result Value Ref Range   WBC 8.3 4.0 - 10.5 K/uL   RBC 4.74 3.87 - 5.11 MIL/uL   Hemoglobin 13.9 12.0 - 15.0 g/dL   HCT 29.5 62.1 - 30.8 %   MCV 92.2 80.0 - 100.0 fL   MCH 29.3 26.0 - 34.0 pg   MCHC 31.8 30.0 - 36.0 g/dL   RDW 65.7 84.6 - 96.2 %   Platelets 176 150 - 400 K/uL   nRBC 0.0 0.0 - 0.2 %   Neutrophils Relative % 75 %  Neutro Abs 6.2 1.7 - 7.7 K/uL   Lymphocytes Relative 13 %   Lymphs Abs 1.1 0.7 - 4.0 K/uL   Monocytes Relative 10 %   Monocytes Absolute 0.8 0.1 - 1.0 K/uL   Eosinophils Relative 2 %   Eosinophils Absolute 0.2 0.0 - 0.5 K/uL   Basophils Relative 0 %   Basophils Absolute 0.0 0.0 - 0.1 K/uL   Immature Granulocytes 0 %   Abs Immature Granulocytes 0.02 0.00 - 0.07 K/uL    Comment: Performed at [facility], [address], [place], [place] [address]  Comprehensive metabolic panel     Status: Abnormal   Collection Time: 11/02/22  4:34 PM  Result Value Ref Range   Sodium 137 135 - 145 mmol/L   Potassium 4.1 3.5 - 5.1 mmol/L    Comment: HEMOLYSIS AT THIS LEVEL MAY AFFECT RESULT   Chloride 106 98 - 111 mmol/L   CO2 21 (L) 22 - 32 mmol/L   Glucose, Bld 91 70 - 99 mg/dL    Comment: Glucose reference range  applies only to samples taken after fasting for at least 8 hours.   BUN 10 8 - 23 mg/dL   Creatinine, Ser 1.91 0.44 - 1.00 mg/dL   Calcium 9.5 8.9 - 47.8 mg/dL   Total Protein 7.1 6.5 - 8.1 g/dL   Albumin 3.8 3.5 - 5.0 g/dL   AST 39 15 - 41 U/L    Comment: HEMOLYSIS AT THIS LEVEL MAY AFFECT RESULT   ALT 18 0 - 44 U/L    Comment: HEMOLYSIS AT THIS LEVEL MAY AFFECT RESULT   Alkaline Phosphatase 67 38 - 126 U/L   Total Bilirubin 2.0 (H) 0.3 - 1.2 mg/dL    Comment: HEMOLYSIS AT THIS LEVEL MAY AFFECT RESULT   GFR, Estimated >60 >60 mL/min    Comment: (NOTE) Calculated using the CKD-EPI Creatinine Equation (2021)    Anion gap 10 5 - 15    Comment: Performed at [facility], [address] [place], [place] [address]  CK     Status: Abnormal   Collection Time: 11/02/22  4:34 PM  Result Value Ref Range   Total CK 448 (H) 38 - 234 U/L    Comment: HEMOLYSIS AT THIS LEVEL MAY AFFECT RESULT Performed at [facility], [address], [place], [place] [address]   Urinalysis, Routine w reflex microscopic -Urine, Clean Catch     Status: Abnormal   Collection Time: 11/02/22  4:34 PM  Result Value Ref Range   Color, Urine YELLOW YELLOW   APPearance HAZY (A) CLEAR   Specific Gravity, Urine 1.010 1.005 - 1.030   pH 7.0 5.0 - 8.0   Glucose, UA NEGATIVE NEGATIVE mg/dL   Hgb urine dipstick MODERATE (A) NEGATIVE   Bilirubin Urine NEGATIVE NEGATIVE   Ketones, ur NEGATIVE NEGATIVE mg/dL   Protein, ur NEGATIVE NEGATIVE mg/dL   Nitrite POSITIVE (A) NEGATIVE   Leukocytes,Ua NEGATIVE NEGATIVE   RBC / HPF 0-5 0 - 5 RBC/hpf   WBC, UA 0-5 0 - 5 WBC/hpf   Bacteria, UA FEW (A) NONE SEEN   Squamous Epithelial / HPF 0-5 0 - 5 /HPF   Mucus PRESENT     Comment: Performed at [facility], [address], [place], [place] [address]   CT PELVIS WO CONTRAST  Result Date: 11/02/2022 CLINICAL DATA:  Trauma, fall EXAM: CT PELVIS WITHOUT CONTRAST TECHNIQUE:  Multidetector CT imaging of the pelvis was performed following  the standard protocol without intravenous contrast. RADIATION DOSE REDUCTION: This exam was performed according to the departmental dose-optimization program which includes automated exposure control, adjustment of the mA and/or kV according to patient size and/or use of iterative reconstruction technique. COMPARISON:  CT abdomen and pelvis done on 07/29/2020 FINDINGS: Urinary Tract: Diverticulum is noted in the left lateral margin of the urinary bladder with no significant change. Bowel:  Visualized bowel loops are unremarkable. Vascular/Lymphatic: Scattered arterial calcifications are seen. Reproductive:  Unremarkable. Other:  There is no ascites or pneumoperitoneum in pelvis. Musculoskeletal: There is recent impacted fracture in subcapital portion of neck of right femur. There is no dislocation. Degenerative changes with bony spurs are noted in left hip. SI joints are symmetrical. Degenerative changes are noted in the pubic symphysis with bony spurs. Degenerative changes are noted in the visualized lower lumbar spine with bony spurs and facet hypertrophy. IMPRESSION: Recent impacted fracture is seen in subcapital portion of neck of right femur. There is no dislocation. Degenerative changes are noted in left hip and lumbar spine. Electronically Signed   By: Ernie Avena M.D.   On: 11/02/2022 18:20   DG Pelvis Portable  Result Date: 11/02/2022 CLINICAL DATA:  Found on floor with swelling and bruising to the left knee. EXAM: PORTABLE PELVIS 1-2 VIEWS COMPARISON:  CT abdomen and pelvis 07/29/2020 FINDINGS: Widening of the right SI joint. Question of acute fracture of the right iliac wing versus projection artifact. Additional cortical irregularity and subtle lucency through the right femoral head neck junction suspicious for minimally displaced impacted fracture. IMPRESSION: 1. Widening of the right SI joint with questionable right iliac wing  fracture. 2. Possible additional minimally displaced and impacted right subcapital femoral neck fracture. 3. CT is recommended to further evaluate these findings. Electronically Signed   By: Minerva Fester M.D.   On: 11/02/2022 17:40   DG Knee Complete 4 Views Left  Result Date: 11/02/2022 CLINICAL DATA:  Fall and left knee pain. EXAM: LEFT KNEE - COMPLETE 4+ VIEW COMPARISON:  None Available. FINDINGS: There is no acute fracture or dislocation. The bones are osteopenic. There is arthritic change of the knee with tricompartmental narrowing most severely involving the lateral compartment. Small suprapatellar effusion. The soft tissues are unremarkable. IMPRESSION: 1. No acute fracture or dislocation. 2. Osteopenia with osteoarthritis. Electronically Signed   By: Elgie Collard M.D.   On: 11/02/2022 17:35   DG Chest Port 1 View  Result Date: 11/02/2022 CLINICAL DATA:  Neighbor found patient lying on the floor. Swelling and bruising to left knee. Fall. EXAM: PORTABLE CHEST 1 VIEW COMPARISON:  05/15/2018 FINDINGS: Limited evaluation of the cardiomediastinal silhouette given patient rotation however this appears grossly similar to 05/15/2018. Aortic atherosclerotic calcification. No focal consolidation, pleural effusion, or pneumothorax. No displaced rib fractures. Breast implants. IMPRESSION: No acute cardiopulmonary disease. Electronically Signed   By: Minerva Fester M.D.   On: 11/02/2022 17:35   CT Cervical Spine Wo Contrast  Result Date: 11/02/2022 CLINICAL DATA:  Neck trauma.  Found on floor. EXAM: CT CERVICAL SPINE WITHOUT CONTRAST TECHNIQUE: Multidetector CT imaging of the cervical spine was performed without intravenous contrast. Multiplanar CT image reconstructions were also generated. RADIATION DOSE REDUCTION: This exam was performed according to the departmental dose-optimization program which includes automated exposure control, adjustment of the mA and/or kV according to patient size and/or use  of iterative reconstruction technique. COMPARISON:  Cervical spine CT 07/29/2020 FINDINGS: Alignment: Unchanged trace anterolisthesis of C5 on C6. Skull base and vertebrae: No acute  fracture or suspicious osseous lesion. Soft tissues and spinal canal: No prevertebral fluid or swelling. No visible canal hematoma. Disc levels: Similar appearance of multilevel disc degeneration, moderate at C4-5. Upper chest: No mass or consolidation in the included lung apices. Other: 1.3 cm right thyroid nodule for which no follow-up imaging is recommended. Mild calcific atherosclerosis at the carotid bifurcations. IMPRESSION: No acute cervical spine fracture or traumatic malalignment. Electronically Signed   By: Sebastian Ache M.D.   On: 11/02/2022 16:33   CT Head Wo Contrast  Result Date: 11/02/2022 CLINICAL DATA:  Trauma EXAM: CT HEAD WITHOUT CONTRAST TECHNIQUE: Contiguous axial images were obtained from the base of the skull through the vertex without intravenous contrast. RADIATION DOSE REDUCTION: This exam was performed according to the departmental dose-optimization program which includes automated exposure control, adjustment of the mA and/or kV according to patient size and/or use of iterative reconstruction technique. COMPARISON:  Head CT 07/29/2020.  MRI head 02/18/2013. FINDINGS: Brain: No evidence of acute infarction, hemorrhage, hydrocephalus, or extra-axial collection. Calcified extra-axial rounded mass in the right parietal region measures 12 mm and appears unchanged previously characterized as meningioma. Cerebellar atrophy again noted. Vascular: Atherosclerotic calcifications are present within the cavernous internal carotid arteries. Skull: Normal. Negative for fracture or focal lesion. Sinuses/Orbits: There is opacification of the right maxillary sinus. Orbits are within normal limits. Other: None. IMPRESSION: 1. No acute intracranial process. 2. Stable calcified extra-axial mass in the right parietal region,  previously characterized as meningioma. 3. Right maxillary sinus disease. Electronically Signed   By: Darliss Cheney M.D.   On: 11/02/2022 16:28    Pending Labs Unresulted Labs (From admission, onward)     Start     Ordered   11/03/22 0500  CBC with Differential/Platelet  Tomorrow morning,   R        11/02/22 1930   11/03/22 0500  Comprehensive metabolic panel  Tomorrow morning,   R        11/02/22 1930   11/03/22 0500  Magnesium  Tomorrow morning,   R        11/02/22 1930   11/03/22 0500  Protime-INR  Tomorrow morning,   R        11/02/22 1930   11/02/22 1931  Magnesium  Add-on,   AD        11/02/22 1930   11/02/22 1746  Urine Culture (for pregnant, neutropenic or urologic patients or patients with an indwelling urinary catheter)  (Urine Labs)  Once,   URGENT       Question:  Indication  Answer:  Altered mental status (if no other cause identified)   11/02/22 1745            Vitals/Pain Today's Vitals   11/02/22 1515 11/02/22 1516 11/02/22 1715 11/02/22 1900  BP: (!) 141/62 (!) 141/62 (!) 149/78   Pulse:  79 88   Resp:  20 18   Temp:    97.8 F (36.6 C)  TempSrc:      SpO2: 98% 98% 100%   Weight:      Height:        Isolation Precautions No active isolations  Medications Medications  acetaminophen (TYLENOL) tablet 650 mg (has no administration in time range)    Or  acetaminophen (TYLENOL) suppository 650 mg (has no administration in time range)  melatonin tablet 3 mg (has no administration in time range)  ondansetron (ZOFRAN) injection 4 mg (has no administration in time range)  naloxone (NARCAN) injection 0.4 mg (  has no administration in time range)  fentaNYL (SUBLIMAZE) injection 25 mcg (has no administration in time range)  sodium chloride 0.9 % bolus 1,000 mL (0 mLs Intravenous Stopped 11/02/22 1931)    Mobility walks with device        R Recommendations: See Admitting Provider Note  Report given to:  Salli Quarry   Additional Notes:  Pt being  admitted for broken femur. Pt a&ox1. Pt lives at home alone and not able to care for herself. Pts son is available by phone when needed and number is in the chart.

## 2022-11-02 NOTE — Progress Notes (Signed)
Patient discussed with the EDP.  Right intracapsular femoral neck fracture.  This is going to require total hip arthroplasty.  Unfortunately due to our scheduling this will likely need to be done on Friday.  Have discussed this case with Dr. Allie Bossier who has agreed to assume care given his OR availability.  We will tentatively place her on the operative schedule for Friday.  If he is able to do sooner he will discuss further with the hospitalist service.  Otherwise full consultation note to come tomorrow.

## 2022-11-03 ENCOUNTER — Other Ambulatory Visit: Payer: Self-pay

## 2022-11-03 DIAGNOSIS — F03918 Unspecified dementia, unspecified severity, with other behavioral disturbance: Secondary | ICD-10-CM

## 2022-11-03 DIAGNOSIS — S72011A Unspecified intracapsular fracture of right femur, initial encounter for closed fracture: Secondary | ICD-10-CM | POA: Diagnosis not present

## 2022-11-03 DIAGNOSIS — W19XXXA Unspecified fall, initial encounter: Secondary | ICD-10-CM | POA: Diagnosis not present

## 2022-11-03 DIAGNOSIS — E440 Moderate protein-calorie malnutrition: Secondary | ICD-10-CM

## 2022-11-03 DIAGNOSIS — I10 Essential (primary) hypertension: Secondary | ICD-10-CM | POA: Diagnosis not present

## 2022-11-03 LAB — CBC WITH DIFFERENTIAL/PLATELET
Abs Immature Granulocytes: 0.02 10*3/uL (ref 0.00–0.07)
Basophils Absolute: 0 10*3/uL (ref 0.0–0.1)
Basophils Relative: 0 %
Eosinophils Absolute: 0.3 10*3/uL (ref 0.0–0.5)
Eosinophils Relative: 6 %
HCT: 40.1 % (ref 36.0–46.0)
Hemoglobin: 12.8 g/dL (ref 12.0–15.0)
Immature Granulocytes: 0 %
Lymphocytes Relative: 15 %
Lymphs Abs: 0.9 10*3/uL (ref 0.7–4.0)
MCH: 28.9 pg (ref 26.0–34.0)
MCHC: 31.9 g/dL (ref 30.0–36.0)
MCV: 90.5 fL (ref 80.0–100.0)
Monocytes Absolute: 0.5 10*3/uL (ref 0.1–1.0)
Monocytes Relative: 9 %
Neutro Abs: 4.2 10*3/uL (ref 1.7–7.7)
Neutrophils Relative %: 70 %
Platelets: 186 10*3/uL (ref 150–400)
RBC: 4.43 MIL/uL (ref 3.87–5.11)
RDW: 13.7 % (ref 11.5–15.5)
WBC: 6 10*3/uL (ref 4.0–10.5)
nRBC: 0 % (ref 0.0–0.2)

## 2022-11-03 LAB — VITAMIN D 25 HYDROXY (VIT D DEFICIENCY, FRACTURES): Vit D, 25-Hydroxy: 25.88 ng/mL — ABNORMAL LOW (ref 30–100)

## 2022-11-03 LAB — PROTIME-INR
INR: 1.1 (ref 0.8–1.2)
Prothrombin Time: 13.7 seconds (ref 11.4–15.2)

## 2022-11-03 LAB — COMPREHENSIVE METABOLIC PANEL
ALT: 17 U/L (ref 0–44)
AST: 25 U/L (ref 15–41)
Albumin: 3.3 g/dL — ABNORMAL LOW (ref 3.5–5.0)
Alkaline Phosphatase: 55 U/L (ref 38–126)
Anion gap: 8 (ref 5–15)
BUN: 9 mg/dL (ref 8–23)
CO2: 24 mmol/L (ref 22–32)
Calcium: 9 mg/dL (ref 8.9–10.3)
Chloride: 105 mmol/L (ref 98–111)
Creatinine, Ser: 0.73 mg/dL (ref 0.44–1.00)
GFR, Estimated: >60 mL/min (ref >60)
Glucose, Bld: 90 mg/dL (ref 70–99)
Potassium: 3.1 mmol/L — ABNORMAL LOW (ref 3.5–5.1)
Sodium: 137 mmol/L (ref 135–145)
Total Bilirubin: 1.2 mg/dL (ref 0.3–1.2)
Total Protein: 6.4 g/dL — ABNORMAL LOW (ref 6.5–8.1)

## 2022-11-03 LAB — URINE CULTURE

## 2022-11-03 LAB — CK: Total CK: 348 U/L — ABNORMAL HIGH (ref 38–234)

## 2022-11-03 LAB — SURGICAL PCR SCREEN
MRSA, PCR: NEGATIVE
Staphylococcus aureus: NEGATIVE

## 2022-11-03 LAB — MAGNESIUM: Magnesium: 2.3 mg/dL (ref 1.7–2.4)

## 2022-11-03 MED ORDER — ENSURE ENLIVE PO LIQD
237.0000 mL | Freq: Three times a day (TID) | ORAL | Status: DC
  Administered 2022-11-03 – 2022-11-05 (×2): 237 mL via ORAL

## 2022-11-03 MED ORDER — ADULT MULTIVITAMIN W/MINERALS CH
1.0000 | ORAL_TABLET | Freq: Every day | ORAL | Status: DC
  Filled 2022-11-03: qty 1

## 2022-11-03 NOTE — Progress Notes (Signed)
Initial Nutrition Assessment  DOCUMENTATION CODES:   Non-severe (moderate) malnutrition in context of chronic illness  INTERVENTION:  - Regular diet.  - Ensure Plus High Protein po BID, each supplement provides 350 kcal and 20 grams of protein. - Encourage intake at all meals and of supplements.  - Multivitamin with minerals daily - Monitor weight trends.   NUTRITION DIAGNOSIS:   Moderate Malnutrition related to chronic illness as evidenced by moderate fat depletion, moderate muscle depletion.  GOAL:   Patient will meet greater than or equal to 90% of their needs  MONITOR:   PO intake, Supplement acceptance, Weight trends  REASON FOR ASSESSMENT:   Malnutrition Screening Tool    ASSESSMENT:   77 y.o. female with medical history significant for essential HTN, GERD, and reportedly advanced dementia who was admitted with right hip fracture.   Patient sitting in bed at time of visit, having breakfast tray (biscuit, 2 sausage patties, grapes) set up for her.   Patient reports she is unsure of a UBW as she "doesn't pay attention to that kind of thing". Denies any recent changes in weight. Per EMR, no weight history since 2020 to assess recent changes. Patient weighed at 145# and then 107# this admission. Suspect initial 145# was carried over from last weight in 2020. Patient unsure which is more accurate.   She endorses eating 3 meals a day at home in addition to 3-4 Ensures. Appetite good at baseline.  When inquiring about her current appetite she reports it is very good. States she is in no pain and in fact does not believe she has a hip fracture and is frustrated at being in the hospital and at her neighbor for "not minding their business" when finding her down on her porch.  Patient reportedly has dementia, so question accuracy of information provided.   Discussed importance or ordering and trying to consume 3 meals a day to support healing and help prevent weight loss during  admission. She is agreeable to receive Ensure.  Medications reviewed and include: -  Labs reviewed:  K+ 3.1   NUTRITION - FOCUSED PHYSICAL EXAM:  Flowsheet Row Most Recent Value  Orbital Region Moderate depletion  Upper Arm Region Moderate depletion  Thoracic and Lumbar Region Mild depletion  Buccal Region Moderate depletion  Temple Region Moderate depletion  Clavicle Bone Region Moderate depletion  Clavicle and Acromion Bone Region Severe depletion  Scapular Bone Region Unable to assess  Dorsal Hand Mild depletion  Patellar Region Mild depletion  Anterior Thigh Region Mild depletion  Posterior Calf Region Mild depletion  Edema (RD Assessment) None  Hair Reviewed  Eyes Reviewed  Mouth Reviewed  Skin Reviewed  Nails Reviewed       Diet Order:   Diet Order             Diet NPO time specified  Diet effective midnight           Diet regular Room service appropriate? Yes; Fluid consistency: Thin  Diet effective now                   EDUCATION NEEDS:  Education needs have been addressed  Skin:  Skin Assessment: Reviewed RN Assessment  Last BM:  5/1  Height:  Ht Readings from Last 1 Encounters:  11/02/22 5\' 2"  (1.575 m)   Weight:  Wt Readings from Last 1 Encounters:  11/02/22 48.7 kg    BMI:  Body mass index is 19.64 kg/m.  Estimated Nutritional Needs:  Kcal:  1600-1750 kcals Protein:  70-85 grams Fluid:  >/= 1.6L    Shelle Iron RD, LDN For contact information, refer to [facility]

## 2022-11-03 NOTE — Progress Notes (Signed)
Patient ID: KENZLEIGH SEDAM, female   DOB: 1945/08/06, 77 y.o.   MRN: [id number] I did just speak in length in detail to the patient's husband who is her POA.  I explained what the situation is with her right hip and our recommendation for treating this surgically.  I discussed the different surgical options as well as the risk and benefits of surgical versus nonsurgical treatment for her right hip femoral neck fracture.  My goal and recommendation is a right total hip arthroplasty given the fact that she does show signs of dementia and she would not adhere to nonweightbearing with cannulated screw fixation.  With a total hip arthroplasty she could weight-bear as tolerated and through an anterior approach we can avoid hip precautions as well.  He understands that thoroughly and fully and does give permission for [place] to proceed with surgery.  I did let him know that nursing would be in contact with him at some point as well as anesthesia and to keep his cell phone with him.  Of note he is in rehab now after having undergone a lower extremity amputation recently.

## 2022-11-03 NOTE — Assessment & Plan Note (Addendum)
Mechanical fall suspected as patient is at high risk of falls due to underlying dementia.

## 2022-11-03 NOTE — Assessment & Plan Note (Addendum)
Substantial behavioral disturbances during this hospitalization, likely exacerbated by acute injury and pain Minimizing uncomfortable stimuli Minimizing mood altering and sedating agents Initiating bowel regimen Initiating melatonin nightly to regulate sleep cycle Frequent redirection by staff Fall precautions

## 2022-11-03 NOTE — Hospital Course (Addendum)
77 year old female with past medical history of hypertension, gastroesophageal reflux disease, dementia who presented to [facility] emergency room 5/1 from home for evaluation of right hip pain after suffering a fall.  Upon evaluation in the emergency department patient was identified to have a new traumatic hip fracture.  Case discussed with Dr. Magnus Ivan with orthopedic surgery who agreed to see the patient in consultation.  The hospitalist group was then called to assess the patient for admission to the hospital.  Patient underwent right anterior total hip arthroplasty by Dr. Magnus Ivan on 11/18/2022.  Patient unfortunately began to vomit dark feculent appearing material the afternoon of 5/5 with immediate development of respiratory arrest followed by cardiac arrest.  Patient status post intubation and transferred to the intensive care unit.  Dr. Isaiah Serge with PCCM.

## 2022-11-03 NOTE — Assessment & Plan Note (Addendum)
Patient underwent right total hip arthroplasty 5/3 with Dr. Magnus Ivan with orthopedic surgery Vitamin D level found to be somewhat low at 25.88, can consider resuming vitamin D supplementation once patient has clinically recovered and is extubated  Eventual plan is for patient to receive physical therapy in a skilled nursing facility

## 2022-11-03 NOTE — Assessment & Plan Note (Addendum)
Protonix 40 mg IV every 12 hours due to dark, feculent appearing vomitus prior to cardiac arrest

## 2022-11-03 NOTE — Assessment & Plan Note (Addendum)
Holding antihypertensives in light of development of cardiac arrest

## 2022-11-03 NOTE — Consult Note (Signed)
Reason for Consult: Right hip subcapital impacted femoral neck fracture Referring Physician: Wonda Olds, EDP and Dr. Hyman Bible is an 77 y.o. female.  HPI: The patient is a 77 year old female with dementia who actually lives at home alone currently due to her husband being in rehab.  Apparently she does have a son who is around.  Yesterday she was found down on the porch by her neighbor.  EMS was called and the patient was brought to the [facility] emergency room.  She was found to have a right hip fracture.  I was able to review the CT scan and plain films that she does have an impacted subcapital femoral neck fracture of the right hip.  One of my colleagues in town was on call asked could I assume the patient's care in terms of her right hip given the fact that we are operating here tomorrow at [facility].  I did come to the bedside to see the patient.  She is awake and alert but definitely combative in terms of denying that she has a right hip fracture and cursing about her neighbors and her being in the hospital and she just wants to be at home.  She denies that she has any hip fracture at all and denies any pain.  Past Medical History:  Diagnosis Date   Acid reflux    Degenerative disc disease, lumbar    Hiatal hernia with GERD    Hypertension    Psoriasis     History reviewed. No pertinent surgical history.  History reviewed. No pertinent family history.  Social History:  reports that she has never smoked. She has never used smokeless tobacco. She reports that she does not drink alcohol and does not use drugs.  Allergies:  Allergies  Allergen Reactions   Beta Adrenergic Blockers Rash and Other (See Comments)    Worsens psoriasis   Codeine Nausea And Vomiting   Amlodipine Nausea Only and Other (See Comments)    Also caused dizziness and had no appetite   Other Other (See Comments)    Per a [facility] doctor, patient was told to "never use any inhalers" (was told it would  worsen her psoriasis)    Medications: I have reviewed the patient's current medications.  Results for orders placed or performed during the hospital encounter of 11/02/22 (from the past 48 hour(s))  CBG monitoring, ED     Status: None   Collection Time: 11/02/22  4:14 PM  Result Value Ref Range   Glucose-Capillary 82 70 - 99 mg/dL    Comment: Glucose reference range applies only to samples taken after fasting for at least 8 hours.  CBC with Differential     Status: None   Collection Time: 11/02/22  4:34 PM  Result Value Ref Range   WBC 8.3 4.0 - 10.5 K/uL   RBC 4.74 3.87 - 5.11 MIL/uL   Hemoglobin 13.9 12.0 - 15.0 g/dL   HCT 40.9 81.1 - 91.4 %   MCV 92.2 80.0 - 100.0 fL   MCH 29.3 26.0 - 34.0 pg   MCHC 31.8 30.0 - 36.0 g/dL   RDW 78.2 95.6 - 21.3 %   Platelets 176 150 - 400 K/uL   nRBC 0.0 0.0 - 0.2 %   Neutrophils Relative % 75 %   Neutro Abs 6.2 1.7 - 7.7 K/uL   Lymphocytes Relative 13 %   Lymphs Abs 1.1 0.7 - 4.0 K/uL   Monocytes Relative 10 %  Monocytes Absolute 0.8 0.1 - 1.0 K/uL   Eosinophils Relative 2 %   Eosinophils Absolute 0.2 0.0 - 0.5 K/uL   Basophils Relative 0 %   Basophils Absolute 0.0 0.0 - 0.1 K/uL   Immature Granulocytes 0 %   Abs Immature Granulocytes 0.02 0.00 - 0.07 K/uL    Comment: Performed at [facility], [address], [place], [place] [address]  Comprehensive metabolic panel     Status: Abnormal   Collection Time: 11/02/22  4:34 PM  Result Value Ref Range   Sodium 137 135 - 145 mmol/L   Potassium 4.1 3.5 - 5.1 mmol/L    Comment: HEMOLYSIS AT THIS LEVEL MAY AFFECT RESULT   Chloride 106 98 - 111 mmol/L   CO2 21 (L) 22 - 32 mmol/L   Glucose, Bld 91 70 - 99 mg/dL    Comment: Glucose reference range applies only to samples taken after fasting for at least 8 hours.   BUN 10 8 - 23 mg/dL   Creatinine, Ser 6.04 0.44 - 1.00 mg/dL   Calcium 9.5 8.9 - 54.0 mg/dL   Total Protein 7.1 6.5 - 8.1 g/dL   Albumin 3.8 3.5 - 5.0 g/dL    AST 39 15 - 41 U/L    Comment: HEMOLYSIS AT THIS LEVEL MAY AFFECT RESULT   ALT 18 0 - 44 U/L    Comment: HEMOLYSIS AT THIS LEVEL MAY AFFECT RESULT   Alkaline Phosphatase 67 38 - 126 U/L   Total Bilirubin 2.0 (H) 0.3 - 1.2 mg/dL    Comment: HEMOLYSIS AT THIS LEVEL MAY AFFECT RESULT   GFR, Estimated >60 >60 mL/min    Comment: (NOTE) Calculated using the CKD-EPI Creatinine Equation (2021)    Anion gap 10 5 - 15    Comment: Performed at [facility], [address], [place], [place] [address]  CK     Status: Abnormal   Collection Time: 11/02/22  4:34 PM  Result Value Ref Range   Total CK 448 (H) 38 - 234 U/L    Comment: HEMOLYSIS AT THIS LEVEL MAY AFFECT RESULT Performed at [facility], [address] [place], [place] [address]   Urinalysis, Routine w reflex microscopic -Urine, Clean Catch     Status: Abnormal   Collection Time: 11/02/22  4:34 PM  Result Value Ref Range   Color, Urine YELLOW YELLOW   APPearance HAZY (A) CLEAR   Specific Gravity, Urine 1.010 1.005 - 1.030   pH 7.0 5.0 - 8.0   Glucose, UA NEGATIVE NEGATIVE mg/dL   Hgb urine dipstick MODERATE (A) NEGATIVE   Bilirubin Urine NEGATIVE NEGATIVE   Ketones, ur NEGATIVE NEGATIVE mg/dL   Protein, ur NEGATIVE NEGATIVE mg/dL   Nitrite POSITIVE (A) NEGATIVE   Leukocytes,Ua NEGATIVE NEGATIVE   RBC / HPF 0-5 0 - 5 RBC/hpf   WBC, UA 0-5 0 - 5 WBC/hpf   Bacteria, UA FEW (A) NONE SEEN   Squamous Epithelial / HPF 0-5 0 - 5 /HPF   Mucus PRESENT     Comment: Performed at [facility], [address] [place], [place] [address]  ABO/Rh     Status: None   Collection Time: 11/02/22  4:34 PM  Result Value Ref Range   ABO/RH(D)      O POS Performed at [facility], [address], [place], [place] [address]   Magnesium     Status: None   Collection Time: 11/02/22  9:13 PM  Result Value Ref Range   Magnesium 1.8 1.7 - 2.4 mg/dL    Comment: Performed at  [facility], [address] [place], [place] [address]  VITAMIN D 25 Hydroxy (Vit-D Deficiency, Fractures)     Status: Abnormal   Collection Time: 11/02/22  9:13 PM  Result Value Ref Range   Vit D, 25-Hydroxy 25.88 (L) 30 - 100 ng/mL    Comment: (NOTE) Vitamin D deficiency has been defined by the Institute of Medicine  and an Endocrine Society practice guideline as a level of serum 25-OH  vitamin D less than 20 ng/mL (1,2). The Endocrine Society went on to  further define vitamin D insufficiency as a level between 21 and 29  ng/mL (2).  1. IOM (Institute of Medicine). 2010. Dietary reference intakes for  calcium and D. [place]: [employer]. 2. Holick MF, Binkley [place], Bischoff-Ferrari HA, et al. Evaluation,  treatment, and prevention of vitamin D deficiency: an Endocrine  Society clinical practice guideline, JCEM. 2011 Jul; 96(7): 1911-30.  Performed at [facility], [address] [place], [place] [address]   Type and screen [facility]     Status: None   Collection Time: 11/02/22  9:13 PM  Result Value Ref Range   ABO/RH(D) O POS    Antibody Screen NEG    Sample Expiration      11/05/2022,2359 Performed at [facility], [address] [place], [place] [address]   Surgical pcr screen     Status: None   Collection Time: 11/03/22 12:47 AM   Specimen: Nasal Mucosa; Nasal Swab  Result Value Ref Range   MRSA, PCR NEGATIVE NEGATIVE   Staphylococcus aureus NEGATIVE NEGATIVE    Comment: (NOTE) The Xpert SA Assay (FDA approved for NASAL specimens in patients 28 years of age and older), is one component of a comprehensive surveillance program. It is not intended to diagnose infection nor to guide or monitor treatment. Performed at [facility], [address], [place], [place] [address]   CBC with Differential/Platelet     Status: None   Collection Time: 11/03/22  8:06 AM   Result Value Ref Range   WBC 6.0 4.0 - 10.5 K/uL   RBC 4.43 3.87 - 5.11 MIL/uL   Hemoglobin 12.8 12.0 - 15.0 g/dL   HCT 65.7 84.6 - 96.2 %   MCV 90.5 80.0 - 100.0 fL   MCH 28.9 26.0 - 34.0 pg   MCHC 31.9 30.0 - 36.0 g/dL   RDW 95.2 84.1 - 32.4 %   Platelets 186 150 - 400 K/uL   nRBC 0.0 0.0 - 0.2 %   Neutrophils Relative % 70 %   Neutro Abs 4.2 1.7 - 7.7 K/uL   Lymphocytes Relative 15 %   Lymphs Abs 0.9 0.7 - 4.0 K/uL   Monocytes Relative 9 %   Monocytes Absolute 0.5 0.1 - 1.0 K/uL   Eosinophils Relative 6 %   Eosinophils Absolute 0.3 0.0 - 0.5 K/uL   Basophils Relative 0 %   Basophils Absolute 0.0 0.0 - 0.1 K/uL   Immature Granulocytes 0 %   Abs Immature Granulocytes 0.02 0.00 - 0.07 K/uL    Comment: Performed at [facility], [address] [place], [place] [address]  CK     Status: Abnormal   Collection Time: 11/03/22  8:06 AM  Result Value Ref Range   Total CK 348 (H) 38 - 234 U/L  Comment: Performed at [facility], [address], [place], [place] [address]  Comprehensive metabolic panel     Status: Abnormal   Collection Time: 11/03/22  8:06 AM  Result Value Ref Range   Sodium 137 135 - 145 mmol/L   Potassium 3.1 (L) 3.5 - 5.1 mmol/L   Chloride 105 98 - 111 mmol/L   CO2 24 22 - 32 mmol/L   Glucose, Bld 90 70 - 99 mg/dL    Comment: Glucose reference range applies only to samples taken after fasting for at least 8 hours.   BUN 9 8 - 23 mg/dL   Creatinine, Ser 6.04 0.44 - 1.00 mg/dL   Calcium 9.0 8.9 - 54.0 mg/dL   Total Protein 6.4 (L) 6.5 - 8.1 g/dL   Albumin 3.3 (L) 3.5 - 5.0 g/dL   AST 25 15 - 41 U/L   ALT 17 0 - 44 U/L   Alkaline Phosphatase 55 38 - 126 U/L   Total Bilirubin 1.2 0.3 - 1.2 mg/dL   GFR, Estimated >98 >11 mL/min    Comment: (NOTE) Calculated using the CKD-EPI Creatinine Equation (2021)    Anion gap 8 5 - 15    Comment: Performed at [facility], [address], [place], [place]  [address]  Magnesium     Status: None   Collection Time: 11/03/22  8:06 AM  Result Value Ref Range   Magnesium 2.3 1.7 - 2.4 mg/dL    Comment: Performed at [facility], [address], [place], [place] [address]  Protime-INR     Status: None   Collection Time: 11/03/22  8:06 AM  Result Value Ref Range   Prothrombin Time 13.7 11.4 - 15.2 seconds   INR 1.1 0.8 - 1.2    Comment: (NOTE) INR goal varies based on device and disease states. Performed at [facility], [address] [place], [place] [address]     CT PELVIS WO CONTRAST  Result Date: 11/02/2022 CLINICAL DATA:  Trauma, fall EXAM: CT PELVIS WITHOUT CONTRAST TECHNIQUE: Multidetector CT imaging of the pelvis was performed following the standard protocol without intravenous contrast. RADIATION DOSE REDUCTION: This exam was performed according to the departmental dose-optimization program which includes automated exposure control, adjustment of the mA and/or kV according to patient size and/or use of iterative reconstruction technique. COMPARISON:  CT abdomen and pelvis done on 07/29/2020 FINDINGS: Urinary Tract: Diverticulum is noted in the left lateral margin of the urinary bladder with no significant change. Bowel:  Visualized bowel loops are unremarkable. Vascular/Lymphatic: Scattered arterial calcifications are seen. Reproductive:  Unremarkable. Other:  There is no ascites or pneumoperitoneum in pelvis. Musculoskeletal: There is recent impacted fracture in subcapital portion of neck of right femur. There is no dislocation. Degenerative changes with bony spurs are noted in left hip. SI joints are symmetrical. Degenerative changes are noted in the pubic symphysis with bony spurs. Degenerative changes are noted in the visualized lower lumbar spine with bony spurs and facet hypertrophy. IMPRESSION: Recent impacted fracture is seen in subcapital portion of neck of right femur. There is no dislocation.  Degenerative changes are noted in left hip and lumbar spine. Electronically Signed   By: Ernie Avena M.D.   On: 11/02/2022 18:20   DG Pelvis Portable  Result Date: 11/02/2022 CLINICAL DATA:  Found on floor with swelling and bruising to the left knee. EXAM: PORTABLE PELVIS 1-2 VIEWS COMPARISON:  CT abdomen and pelvis 07/29/2020 FINDINGS: Widening of the right  SI joint. Question of acute fracture of the right iliac wing versus projection artifact. Additional cortical irregularity and subtle lucency through the right femoral head neck junction suspicious for minimally displaced impacted fracture. IMPRESSION: 1. Widening of the right SI joint with questionable right iliac wing fracture. 2. Possible additional minimally displaced and impacted right subcapital femoral neck fracture. 3. CT is recommended to further evaluate these findings. Electronically Signed   By: Minerva Fester M.D.   On: 11/02/2022 17:40   DG Knee Complete 4 Views Left  Result Date: 11/02/2022 CLINICAL DATA:  Fall and left knee pain. EXAM: LEFT KNEE - COMPLETE 4+ VIEW COMPARISON:  None Available. FINDINGS: There is no acute fracture or dislocation. The bones are osteopenic. There is arthritic change of the knee with tricompartmental narrowing most severely involving the lateral compartment. Small suprapatellar effusion. The soft tissues are unremarkable. IMPRESSION: 1. No acute fracture or dislocation. 2. Osteopenia with osteoarthritis. Electronically Signed   By: Elgie Collard M.D.   On: 11/02/2022 17:35   DG Chest Port 1 View  Result Date: 11/02/2022 CLINICAL DATA:  Neighbor found patient lying on the floor. Swelling and bruising to left knee. Fall. EXAM: PORTABLE CHEST 1 VIEW COMPARISON:  05/15/2018 FINDINGS: Limited evaluation of the cardiomediastinal silhouette given patient rotation however this appears grossly similar to 05/15/2018. Aortic atherosclerotic calcification. No focal consolidation, pleural effusion, or  pneumothorax. No displaced rib fractures. Breast implants. IMPRESSION: No acute cardiopulmonary disease. Electronically Signed   By: Minerva Fester M.D.   On: 11/02/2022 17:35   CT Cervical Spine Wo Contrast  Result Date: 11/02/2022 CLINICAL DATA:  Neck trauma.  Found on floor. EXAM: CT CERVICAL SPINE WITHOUT CONTRAST TECHNIQUE: Multidetector CT imaging of the cervical spine was performed without intravenous contrast. Multiplanar CT image reconstructions were also generated. RADIATION DOSE REDUCTION: This exam was performed according to the departmental dose-optimization program which includes automated exposure control, adjustment of the mA and/or kV according to patient size and/or use of iterative reconstruction technique. COMPARISON:  Cervical spine CT 07/29/2020 FINDINGS: Alignment: Unchanged trace anterolisthesis of C5 on C6. Skull base and vertebrae: No acute fracture or suspicious osseous lesion. Soft tissues and spinal canal: No prevertebral fluid or swelling. No visible canal hematoma. Disc levels: Similar appearance of multilevel disc degeneration, moderate at C4-5. Upper chest: No mass or consolidation in the included lung apices. Other: 1.3 cm right thyroid nodule for which no follow-up imaging is recommended. Mild calcific atherosclerosis at the carotid bifurcations. IMPRESSION: No acute cervical spine fracture or traumatic malalignment. Electronically Signed   By: Sebastian Ache M.D.   On: 11/02/2022 16:33   CT Head Wo Contrast  Result Date: 11/02/2022 CLINICAL DATA:  Trauma EXAM: CT HEAD WITHOUT CONTRAST TECHNIQUE: Contiguous axial images were obtained from the base of the skull through the vertex without intravenous contrast. RADIATION DOSE REDUCTION: This exam was performed according to the departmental dose-optimization program which includes automated exposure control, adjustment of the mA and/or kV according to patient size and/or use of iterative reconstruction technique. COMPARISON:  Head  CT 07/29/2020.  MRI head 02/18/2013. FINDINGS: Brain: No evidence of acute infarction, hemorrhage, hydrocephalus, or extra-axial collection. Calcified extra-axial rounded mass in the right parietal region measures 12 mm and appears unchanged previously characterized as meningioma. Cerebellar atrophy again noted. Vascular: Atherosclerotic calcifications are present within the cavernous internal carotid arteries. Skull: Normal. Negative for fracture or focal lesion. Sinuses/Orbits: There is opacification of the right maxillary sinus. Orbits are within normal limits. Other: None. IMPRESSION:  1. No acute intracranial process. 2. Stable calcified extra-axial mass in the right parietal region, previously characterized as meningioma. 3. Right maxillary sinus disease. Electronically Signed   By: Darliss Cheney M.D.   On: 11/02/2022 16:28    Review of Systems Blood pressure (!) 135/59, pulse 63, temperature 97.7 F (36.5 C), temperature source Oral, resp. rate 17, height 5\' 2"  (1.575 m), weight 48.7 kg, SpO2 99 %. Physical Exam Vitals reviewed.  Constitutional:      Appearance: Normal appearance. She is normal weight.  HENT:     Head: Normocephalic and atraumatic.  Eyes:     Extraocular Movements: Extraocular movements intact.     Pupils: Pupils are equal, round, and reactive to light.  Cardiovascular:     Rate and Rhythm: Normal rate.  Pulmonary:     Effort: Pulmonary effort is normal.  Musculoskeletal:     Cervical back: Normal range of motion.  Neurological:     Mental Status: She is alert. Mental status is at baseline.   Her leg lengths are equal.  I can actually flex her right hip through internal and external rotation and she does have some guarding and exhibits some pain but surprisingly I can move that hip decently around due to the impacted fracture.    Assessment/Plan: Right hip impacted subcapital femoral neck fracture.  This is certainly a tough situation.  A fracture like this is  usually treated with some type of arthroplasty whether it is a partial versus total hip arthroplasty.  Sometimes we can get by with just placing 3 cannulated screws through a small incision but the patient would need to be completely compliant with nonweightbearing for 4 to 6 weeks.  Given this patient's current medical status, she would not be able to comply with being nonweightbearing.  It is not safe for her being at home alone either given this type of injury.  My recommendation will be a total hip arthroplasty in order to allow the patient to fully weight-bear and this will be done to an anterior approach so we can avoid hip precautions.  Obviously she will not be able to give consent for surgery like this nor will she agree to surgery like this.  We will reach out to her son for obtaining consent for surgery tomorrow.  She should be n.p.o. after midnight tonight.  Again, I will try to reach out to the son at some point again.  I tried to call his phone but no one picked up and the voicemail said the mailbox has not been set up.  Kathryne Hitch 11/03/2022, 10:51 AM

## 2022-11-03 NOTE — TOC Initial Note (Signed)
Transition of Care [facility]) - Initial/Assessment Note    Patient Details  Name: Eileen Nguyen MRN: [id number] Date of Birth: [birth year]  Transition of Care [facility]) CM/SW Contact:    Amada Jupiter, LCSW Phone Number: 11/03/2022, 1:13 PM  Clinical Narrative:                  Met with pt today to attempt discussion about her overall situation, need for surgery and possible rehab following.  Pt very guarded and quickly dismisses report that she has had a fall and hip fx - "I cannot believe that." Attempted to explain that this has been shown with xray and discussed need for surgery, however, pt states, "that's not happening."  Attempted to engage with pt and let he know I had worked with her husband when he was at [facility] a few months ago.  Pt does not engage except to say she doesn't know where he is now.  I was able to determine that spouse, Eileen Nguyen,  is currently at [facility] for SNF following recent BKA.  Have spoken with the [facility] at [facility] who has been able to get correct cell# for spouse.  I have contacted spouse and he is aware of wife's fall/ fx and need to secure consent for surgery.  Spouse is very agreeable to providing consent and have provided # to attending MD and to Dr. Magnus Ivan.  Spouse reports that pt has been declining rapidly at home with cognition and states, "she's definitely got dementia... I told her months ago that she needed to go somewhere to be looked after but she wouldn't do it... and now this has happened." Spouse adds that he and wife have had limited contact with their son, Eileen Nguyen, but does note that "he's been coming around a little more lately" and asks that he be left on contact list.    At this point, TOC will continue to follow for dc planning needs which are expected to be SNF.  May benefit from psychiatry consult? Or review possible medications for behavioral concerns.  Expected Discharge Plan: Skilled Nursing Facility Barriers to Discharge: Continued  Medical Work up, SNF Pending bed offer, English as a second language teacher, Requiring sitter/restraints, Unsafe home situation, Other (must enter comment) (pt currently not agreeable to surgery or follow up rehab)   Patient Goals and CMS Choice Patient states their goals for this hospitalization and ongoing recovery are:: to go home          Expected Discharge Plan and Services In-house Referral: Clinical Social Work     Living arrangements for the past 2 months: Single Family Home                                      Prior Living Arrangements/Services Living arrangements for the past 2 months: Single Family Home Lives with:: Spouse (however, spouse has been at [facility] for a few weeks) Patient language and need for interpreter reviewed:: Yes Do you feel safe going back to the place where you live?: Yes      Need for Family Participation in Patient Care: Yes (Comment) Care giver support system in place?: No (comment)   Criminal Activity/Legal Involvement Pertinent to Current Situation/Hospitalization: No - Comment as needed  Activities of Daily Living Home Assistive Devices/Equipment: Other (Comment) (unknown- patient confused and uncooperative) ADL Screening (condition at time of admission) Patient's cognitive ability adequate to safely complete daily activities?:  No Is the patient deaf or have difficulty hearing?: No Does the patient have difficulty seeing, even when wearing glasses/contacts?: No Does the patient have difficulty concentrating, remembering, or making decisions?: Yes Patient able to express need for assistance with ADLs?: No Does the patient have difficulty dressing or bathing?: No Independently performs ADLs?: Yes (appropriate for developmental age) (patient was apparently living alone as husband is in a rehab facility) Does the patient have difficulty walking or climbing stairs?: Yes Weakness of Legs: Both Weakness of Arms/Hands: None  Permission  Sought/Granted Permission sought to share information with : Family Supports Permission granted to share information with : No (pt with significant resistance to any contact with family on her behalf, however, very poor decision making skills and have reached out to pt's spouse due to these concerns)  Share Information with NAME: spouse, Eileen Nguyen @ [phone]           Emotional Assessment Appearance:: Appears stated age Attitude/Demeanor/Rapport: Complaining, Guarded, Suspicious, Irrational Affect (typically observed): Defensive, Irritable Orientation: : Oriented to Self, Oriented to Place, Oriented to  Time, Oriented to Situation Alcohol / Substance Use: Not Applicable Psych Involvement: No (comment)  Admission diagnosis:  Closed right hip fracture (HCC) [S72.001A] Patient Active Problem List   Diagnosis Date Noted   Closed subcapital fracture of neck of femur, right, initial encounter (HCC) 11/03/2022   Closed right hip fracture (HCC) 11/02/2022   Fall at home, initial encounter 11/02/2022   Elevated CPK 11/02/2022   GERD (gastroesophageal reflux disease) 03/26/2018   Hiatal hernia 01/06/2016   Hypokalemia 01/06/2016   [facility] spotted fever) 01/06/2016   Anxiety 04/10/2014   Essential hypertension 04/10/2014   Preventative health care 04/10/2014   Psoriasis 04/10/2014   PCP:  Pcp, No Pharmacy:   [id number] [place], [place] - [address]  [address] Phone: [phone] Fax: [phone]     Social Determinants of Health (SDOH) Social History: SDOH Screenings   Food Insecurity: Patient Unable To Answer (11/03/2022)  Tobacco Use: Low Risk  (11/02/2022)   SDOH Interventions:     Readmission Risk Interventions    11/03/2022    1:08 PM  Readmission Risk Prevention Plan  Post Dischage Appt Not Complete  Appt Comments pt has not been seen in some time per spouse  Medication Screening Complete   Transportation Screening Complete

## 2022-11-03 NOTE — Assessment & Plan Note (Addendum)
Longstanding poor oral intake with concurrent poor muscle tone consistent with some degree of protein calorie malnutrition. Oral intake is exceedingly poor today Nutrition following Daily multivitamin Ensure nutritional supplement 3 times daily in between meals

## 2022-11-03 NOTE — Plan of Care (Signed)
  Problem: Education: Goal: Knowledge of General Education information will improve Description: Including pain rating scale, medication(s)/side effects and non-pharmacologic comfort measures Outcome: Progressing   Problem: Pain Managment: Goal: General experience of comfort will improve Outcome: Progressing   Problem: Safety: Goal: Ability to remain free from injury will improve Outcome: Progressing   

## 2022-11-03 NOTE — Progress Notes (Signed)
PROGRESS NOTE   Eileen Nguyen  WUJ:[id number] DOB: 1946/02/03 DOA: 11/02/2022 PCP: Pcp, No   Date of Service: the patient was seen and examined on 11/03/2022  Brief Narrative:  77 year old female with past medical history of hypertension, gastroesophageal reflux disease, dementia who presented to [facility] emergency room 5/1 from home for evaluation of right hip pain after suffering a fall.  Upon evaluation in the emergency department patient was identified to have a new traumatic hip fracture.  Case discussed with Dr. Magnus Ivan with orthopedic surgery who agreed to see the patient in consultation.  The hospitalist group was then called to assess the patient for admission to the hospital.   Assessment and Plan: * Closed subcapital fracture of neck of femur, right, initial encounter [facility]) Case discussed at length with Dr. Magnus Ivan with orthopedic surgery. Dr. Magnus Ivan was able to have a phone conversation with the husband who happens to be the decision maker for the patient who agrees to proceed with the surgery. Will continue as needed opiate-based analgesics for associated pain Obtaining vitamin D level N.p.o. after midnight  Fall at home, initial encounter Mechanical fall suspected  Dementia with behavioral disturbance (HCC) Longstanding known history of dementia and cognitive deficit Minimizing uncomfortable stimuli Minimizing mood altering and sedating agents Encouraging family to remain at bedside is much as possible Frequent redirection by staff Fall precautions   Essential hypertension Resume patients home regimen of oral antihypertensives Titrate antihypertensive regimen as necessary to achieve adequate BP control PRN intravenous antihypertensives for excessively elevated blood pressure   GERD (gastroesophageal reflux disease) Continuing home regimen of daily PPI therapy.   Protein-calorie malnutrition, moderate (HCC) Longstanding poor oral intake with  concurrent poor muscle tone consistent with some degree of protein calorie malnutrition. Nutrition following Daily multivitamin Ensure nutritional supplement 3 times daily in between meals     Subjective:  Patient is unable to answer questions appropriately due to advanced dementia.  Physical Exam:  Vitals:   11/03/22 0947 11/03/22 1325 11/03/22 1824 11/03/22 2103  BP: (!) 135/59 133/84 (!) 153/72 (!) 140/55  Pulse: 63 82 64 61  Resp: 17 18  17   Temp: 97.7 F (36.5 C) 97.8 F (36.6 C) 97.8 F (36.6 C) 98.8 F (37.1 C)  TempSrc: Oral Oral Oral   SpO2: 99% 100% 97% 100%  Weight:      Height:        Constitutional: Awake alert and oriented x 1, patient exhibiting intermittent bouts of agitation. Skin: no rashes, no lesions, good skin turgor noted. Eyes: Pupils are equally reactive to light.  No evidence of scleral icterus or conjunctival pallor.  ENMT: Moist mucous membranes noted.  Posterior pharynx clear of any exudate or lesions.   Respiratory: clear to auscultation bilaterally, no wheezing, no crackles. Normal respiratory effort. No accessory muscle use.  Cardiovascular: Regular rate and rhythm, no murmurs / rubs / gallops. No extremity edema. 2+ pedal pulses. No carotid bruits.  Abdomen: Abdomen is soft and nontender.  No evidence of intra-abdominal masses.  Positive bowel sounds noted in all quadrants.   Musculoskeletal: Pain with both passive and active range of motion of the right hip joint.   Data Reviewed:  I have personally reviewed and interpreted labs, imaging.  Significant findings are   CBC: Recent Labs  Lab 11/02/22 1634 11/03/22 0806  WBC 8.3 6.0  NEUTROABS 6.2 4.2  HGB 13.9 12.8  HCT 43.7 40.1  MCV 92.2 90.5  PLT 176 186   Basic Metabolic Panel:  Recent Labs  Lab 11/02/22 1634 11/02/22 2113 11/03/22 0806  NA 137  --  137  K 4.1  --  3.1*  CL 106  --  105  CO2 21*  --  24  GLUCOSE 91  --  90  BUN 10  --  9  CREATININE 0.76  --  0.73   CALCIUM 9.5  --  9.0  MG  --  1.8 2.3   GFR: Estimated Creatinine Clearance: 46 mL/min (by C-G formula based on SCr of 0.73 mg/dL). Liver Function Tests: Recent Labs  Lab 11/02/22 1634 11/03/22 0806  AST 39 25  ALT 18 17  ALKPHOS 67 55  BILITOT 2.0* 1.2  PROT 7.1 6.4*  ALBUMIN 3.8 3.3*    Coagulation Profile: Recent Labs  Lab 11/03/22 0806  INR 1.1      Code Status:  Full code.  Code status   Severity of Illness:  The appropriate patient status for this patient is INPATIENT. Inpatient status is judged to be reasonable and necessary in order to provide the required intensity of service to ensure the patient's safety. The patient's presenting symptoms, physical exam findings, and initial radiographic and laboratory data in the context of their chronic comorbidities is felt to place them at high risk for further clinical deterioration. Furthermore, it is not anticipated that the patient will be medically stable for discharge from the hospital within 2 midnights of admission.   * I certify that at the point of admission it is my clinical judgment that the patient will require inpatient hospital care spanning beyond 2 midnights from the point of admission due to high intensity of service, high risk for further deterioration and high frequency of surveillance required.*  Time spent:  56 minutes  Author:  Marinda Elk MD  11/03/2022 11:22 PM

## 2022-11-03 NOTE — Plan of Care (Signed)
  Problem: Education: Goal: Knowledge of General Education information will improve Description: Including pain rating scale, medication(s)/side effects and non-pharmacologic comfort measures Outcome: Progressing   Problem: Activity: Goal: Risk for activity intolerance will decrease Outcome: Progressing   Problem: Pain Managment: Goal: General experience of comfort will improve Outcome: Progressing   

## 2022-11-04 ENCOUNTER — Encounter (HOSPITAL_COMMUNITY): Admission: EM | Disposition: E | Payer: Self-pay | Source: Home / Self Care | Attending: Internal Medicine

## 2022-11-04 ENCOUNTER — Inpatient Hospital Stay (HOSPITAL_COMMUNITY): Payer: 59

## 2022-11-04 ENCOUNTER — Other Ambulatory Visit: Payer: Self-pay

## 2022-11-04 ENCOUNTER — Encounter (HOSPITAL_COMMUNITY): Payer: Self-pay | Admitting: Internal Medicine

## 2022-11-04 ENCOUNTER — Inpatient Hospital Stay (HOSPITAL_COMMUNITY): Payer: 59 | Admitting: Anesthesiology

## 2022-11-04 DIAGNOSIS — S72011A Unspecified intracapsular fracture of right femur, initial encounter for closed fracture: Secondary | ICD-10-CM

## 2022-11-04 DIAGNOSIS — F0394 Unspecified dementia, unspecified severity, with anxiety: Secondary | ICD-10-CM

## 2022-11-04 DIAGNOSIS — I10 Essential (primary) hypertension: Secondary | ICD-10-CM

## 2022-11-04 DIAGNOSIS — E559 Vitamin D deficiency, unspecified: Secondary | ICD-10-CM | POA: Diagnosis present

## 2022-11-04 DIAGNOSIS — W19XXXA Unspecified fall, initial encounter: Secondary | ICD-10-CM | POA: Diagnosis not present

## 2022-11-04 DIAGNOSIS — F03918 Unspecified dementia, unspecified severity, with other behavioral disturbance: Secondary | ICD-10-CM | POA: Diagnosis not present

## 2022-11-04 DIAGNOSIS — E876 Hypokalemia: Secondary | ICD-10-CM

## 2022-11-04 HISTORY — PX: TOTAL HIP ARTHROPLASTY: SHX124

## 2022-11-04 LAB — URINE CULTURE: Culture: >=100000 — AB

## 2022-11-04 LAB — COMPREHENSIVE METABOLIC PANEL
ALT: 18 U/L (ref 0–44)
AST: 23 U/L (ref 15–41)
Albumin: 3.6 g/dL (ref 3.5–5.0)
Alkaline Phosphatase: 59 U/L (ref 38–126)
Anion gap: 11 (ref 5–15)
BUN: 13 mg/dL (ref 8–23)
CO2: 29 mmol/L (ref 22–32)
Calcium: 9.3 mg/dL (ref 8.9–10.3)
Chloride: 103 mmol/L (ref 98–111)
Creatinine, Ser: 0.77 mg/dL (ref 0.44–1.00)
GFR, Estimated: >60 mL/min (ref >60)
Glucose, Bld: 130 mg/dL — ABNORMAL HIGH (ref 70–99)
Potassium: 3.1 mmol/L — ABNORMAL LOW (ref 3.5–5.1)
Sodium: 143 mmol/L (ref 135–145)
Total Bilirubin: 0.8 mg/dL (ref 0.3–1.2)
Total Protein: 6.9 g/dL (ref 6.5–8.1)

## 2022-11-04 LAB — MAGNESIUM: Magnesium: 2.4 mg/dL (ref 1.7–2.4)

## 2022-11-04 SURGERY — ARTHROPLASTY, HIP, TOTAL, ANTERIOR APPROACH
Anesthesia: Spinal | Site: Hip | Laterality: Right

## 2022-11-04 MED ORDER — CEFAZOLIN SODIUM-DEXTROSE 1-4 GM/50ML-% IV SOLN
1.0000 g | Freq: Four times a day (QID) | INTRAVENOUS | Status: AC
  Administered 2022-11-04 – 2022-11-05 (×2): 1 g via INTRAVENOUS
  Filled 2022-11-04 (×2): qty 50

## 2022-11-04 MED ORDER — FENTANYL CITRATE PF 50 MCG/ML IJ SOSY
PREFILLED_SYRINGE | INTRAMUSCULAR | Status: AC
  Filled 2022-11-04: qty 1

## 2022-11-04 MED ORDER — METOCLOPRAMIDE HCL 5 MG PO TABS: 5.0000 mg | ORAL_TABLET | Freq: Three times a day (TID) | ORAL | Status: DC | PRN

## 2022-11-04 MED ORDER — CEFAZOLIN SODIUM-DEXTROSE 2-4 GM/100ML-% IV SOLN
2.0000 g | Freq: Once | INTRAVENOUS | Status: AC
  Administered 2022-11-04: 2 g via INTRAVENOUS
  Filled 2022-11-04: qty 100

## 2022-11-04 MED ORDER — 0.9 % SODIUM CHLORIDE (POUR BTL) OPTIME
TOPICAL | Status: DC | PRN
  Administered 2022-11-04: 1000 mL

## 2022-11-04 MED ORDER — SODIUM CHLORIDE 0.9 % IR SOLN
Status: DC | PRN
  Administered 2022-11-04: 1000 mL

## 2022-11-04 MED ORDER — PROCHLORPERAZINE EDISYLATE 10 MG/2ML IJ SOLN
5.0000 mg | Freq: Once | INTRAMUSCULAR | Status: AC
  Administered 2022-11-04: 5 mg via INTRAVENOUS
  Filled 2022-11-04: qty 2

## 2022-11-04 MED ORDER — MENTHOL 3 MG MT LOZG: 1.0000 | LOZENGE | OROMUCOSAL | Status: DC | PRN

## 2022-11-04 MED ORDER — HYDROCODONE-ACETAMINOPHEN 5-325 MG PO TABS
1.0000 | ORAL_TABLET | ORAL | Status: DC | PRN
  Administered 2022-11-05: 2 via ORAL
  Filled 2022-11-04: qty 2

## 2022-11-04 MED ORDER — PROPOFOL 10 MG/ML IV BOLUS
INTRAVENOUS | Status: DC | PRN
  Administered 2022-11-04 (×2): 20 mg via INTRAVENOUS
  Administered 2022-11-04: 30 mg via INTRAVENOUS

## 2022-11-04 MED ORDER — ACETAMINOPHEN 325 MG PO TABS: 325.0000 mg | ORAL_TABLET | Freq: Four times a day (QID) | ORAL | Status: DC | PRN

## 2022-11-04 MED ORDER — STERILE WATER FOR IRRIGATION IR SOLN
Status: DC | PRN
  Administered 2022-11-04: 2000 mL

## 2022-11-04 MED ORDER — VITAMIN D (ERGOCALCIFEROL) 1.25 MG (50000 UNIT) PO CAPS
50000.0000 [IU] | ORAL_CAPSULE | ORAL | Status: DC
  Administered 2022-11-05: 50000 [IU] via ORAL
  Filled 2022-11-04: qty 1

## 2022-11-04 MED ORDER — ACETAMINOPHEN 10 MG/ML IV SOLN
INTRAVENOUS | Status: AC
  Filled 2022-11-04: qty 100

## 2022-11-04 MED ORDER — PROPOFOL 1000 MG/100ML IV EMUL
INTRAVENOUS | Status: AC
  Filled 2022-11-04: qty 100

## 2022-11-04 MED ORDER — MORPHINE SULFATE (PF) 2 MG/ML IV SOLN
0.5000 mg | INTRAVENOUS | Status: DC | PRN
  Administered 2022-11-06: 0.5 mg via INTRAVENOUS
  Filled 2022-11-04: qty 1

## 2022-11-04 MED ORDER — PROPOFOL 500 MG/50ML IV EMUL
INTRAVENOUS | Status: DC | PRN
  Administered 2022-11-04: 25 ug/kg/min via INTRAVENOUS

## 2022-11-04 MED ORDER — HYDROCODONE-ACETAMINOPHEN 7.5-325 MG PO TABS: 1.0000 | ORAL_TABLET | ORAL | Status: DC | PRN

## 2022-11-04 MED ORDER — SODIUM CHLORIDE 0.9 % IV SOLN: INTRAVENOUS | Status: DC

## 2022-11-04 MED ORDER — POTASSIUM CHLORIDE CRYS ER 20 MEQ PO TBCR: 40.0000 meq | EXTENDED_RELEASE_TABLET | Freq: Once | ORAL | Status: DC

## 2022-11-04 MED ORDER — ONDANSETRON HCL 4 MG/2ML IJ SOLN
4.0000 mg | Freq: Four times a day (QID) | INTRAMUSCULAR | Status: DC | PRN
  Administered 2022-11-04 – 2022-11-06 (×3): 4 mg via INTRAVENOUS
  Filled 2022-11-04 (×3): qty 2

## 2022-11-04 MED ORDER — ONDANSETRON HCL 4 MG/2ML IJ SOLN
INTRAMUSCULAR | Status: AC
  Filled 2022-11-04: qty 2

## 2022-11-04 MED ORDER — DEXAMETHASONE SODIUM PHOSPHATE 10 MG/ML IJ SOLN
INTRAMUSCULAR | Status: AC
  Filled 2022-11-04: qty 1

## 2022-11-04 MED ORDER — FENTANYL CITRATE PF 50 MCG/ML IJ SOSY
PREFILLED_SYRINGE | INTRAMUSCULAR | Status: AC
  Administered 2022-11-04: 25 ug via INTRAVENOUS
  Filled 2022-11-04: qty 2

## 2022-11-04 MED ORDER — FENTANYL CITRATE PF 50 MCG/ML IJ SOSY
12.5000 ug | PREFILLED_SYRINGE | Freq: Once | INTRAMUSCULAR | Status: AC
  Administered 2022-11-04: 12.5 ug via INTRAVENOUS

## 2022-11-04 MED ORDER — BUPIVACAINE IN DEXTROSE 0.75-8.25 % IT SOLN
INTRATHECAL | Status: DC | PRN
  Administered 2022-11-04: 1.6 mL via INTRATHECAL

## 2022-11-04 MED ORDER — DOCUSATE SODIUM 100 MG PO CAPS
100.0000 mg | ORAL_CAPSULE | Freq: Two times a day (BID) | ORAL | Status: DC
  Filled 2022-11-04 (×2): qty 1

## 2022-11-04 MED ORDER — METOCLOPRAMIDE HCL 5 MG/ML IJ SOLN
5.0000 mg | Freq: Three times a day (TID) | INTRAMUSCULAR | Status: DC | PRN
  Administered 2022-11-05: 5 mg via INTRAVENOUS
  Filled 2022-11-04: qty 2

## 2022-11-04 MED ORDER — LACTATED RINGERS IV SOLN: INTRAVENOUS | Status: DC

## 2022-11-04 MED ORDER — ASPIRIN 81 MG PO CHEW
81.0000 mg | CHEWABLE_TABLET | Freq: Two times a day (BID) | ORAL | Status: DC
  Administered 2022-11-05: 81 mg via ORAL
  Filled 2022-11-04 (×4): qty 1

## 2022-11-04 MED ORDER — PROPOFOL 10 MG/ML IV BOLUS
INTRAVENOUS | Status: AC
  Filled 2022-11-04: qty 20

## 2022-11-04 MED ORDER — LIDOCAINE HCL (PF) 2 % IJ SOLN
INTRAMUSCULAR | Status: AC
  Filled 2022-11-04: qty 5

## 2022-11-04 MED ORDER — ONDANSETRON HCL 4 MG/2ML IJ SOLN
INTRAMUSCULAR | Status: DC | PRN
  Administered 2022-11-04: 4 mg via INTRAVENOUS

## 2022-11-04 MED ORDER — POTASSIUM CHLORIDE 10 MEQ/100ML IV SOLN
10.0000 meq | INTRAVENOUS | Status: AC
  Administered 2022-11-05 (×4): 10 meq via INTRAVENOUS
  Filled 2022-11-04 (×4): qty 100

## 2022-11-04 MED ORDER — ACETAMINOPHEN 10 MG/ML IV SOLN
INTRAVENOUS | Status: DC | PRN
  Administered 2022-11-04: 1000 mg via INTRAVENOUS

## 2022-11-04 MED ORDER — FENTANYL CITRATE PF 50 MCG/ML IJ SOSY
25.0000 ug | PREFILLED_SYRINGE | INTRAMUSCULAR | Status: DC | PRN
  Administered 2022-11-04: 25 ug via INTRAVENOUS

## 2022-11-04 MED ORDER — ONDANSETRON HCL 4 MG PO TABS: 4.0000 mg | ORAL_TABLET | Freq: Four times a day (QID) | ORAL | Status: DC | PRN

## 2022-11-04 MED ORDER — TRANEXAMIC ACID-NACL 1000-0.7 MG/100ML-% IV SOLN
1000.0000 mg | INTRAVENOUS | Status: AC
  Administered 2022-11-04: 1000 mg via INTRAVENOUS
  Filled 2022-11-04: qty 100

## 2022-11-04 MED ORDER — PHENOL 1.4 % MT LIQD: 1.0000 | OROMUCOSAL | Status: DC | PRN

## 2022-11-04 SURGICAL SUPPLY — 43 items
APL SKNCLS STERI-STRIP NONHPOA (GAUZE/BANDAGES/DRESSINGS)
BAG COUNTER SPONGE SURGICOUNT (BAG) ×1 IMPLANT
BAG SPEC THK2 15X12 ZIP CLS (MISCELLANEOUS)
BAG SPNG CNTER NS LX DISP (BAG) ×1
BAG ZIPLOCK 12X15 (MISCELLANEOUS) IMPLANT
BENZOIN TINCTURE PRP APPL 2/3 (GAUZE/BANDAGES/DRESSINGS) IMPLANT
BLADE SAW SGTL 18X1.27X75 (BLADE) ×1 IMPLANT
COVER PERINEAL POST (MISCELLANEOUS) ×1 IMPLANT
COVER SURGICAL LIGHT HANDLE (MISCELLANEOUS) ×1 IMPLANT
CUP SECTOR GRIPTON 50MM (Cup) IMPLANT
DRAPE FOOT SWITCH (DRAPES) ×1 IMPLANT
DRAPE STERI IOBAN 125X83 (DRAPES) ×1 IMPLANT
DRAPE U-SHAPE 47X51 STRL (DRAPES) ×2 IMPLANT
DRSG AQUACEL AG ADV 3.5X10 (GAUZE/BANDAGES/DRESSINGS) ×1 IMPLANT
DURAPREP 26ML APPLICATOR (WOUND CARE) ×1 IMPLANT
ELECT REM PT RETURN 15FT ADLT (MISCELLANEOUS) ×1 IMPLANT
FEM STEM 12/14 TAPER SZ 4 HIP (Orthopedic Implant) ×1 IMPLANT
FEMORAL STEM 12/14 TPR SZ4 HIP (Orthopedic Implant) IMPLANT
GAUZE XEROFORM 1X8 LF (GAUZE/BANDAGES/DRESSINGS) ×1 IMPLANT
GLOVE BIO SURGEON STRL SZ7.5 (GLOVE) ×1 IMPLANT
GLOVE BIOGEL PI IND STRL 8 (GLOVE) ×2 IMPLANT
GLOVE ECLIPSE 8.0 STRL XLNG CF (GLOVE) ×1 IMPLANT
GOWN STRL REUS W/ TWL XL LVL3 (GOWN DISPOSABLE) ×2 IMPLANT
GOWN STRL REUS W/TWL XL LVL3 (GOWN DISPOSABLE) ×2
HANDPIECE INTERPULSE COAX TIP (DISPOSABLE) ×1
HEAD FEM STD 32X+5 STRL (Hips) IMPLANT
HOLDER FOLEY CATH W/STRAP (MISCELLANEOUS) ×1 IMPLANT
KIT TURNOVER KIT A (KITS) IMPLANT
LINER ACET PNNCL PLUS4 NEUTRAL (Hips) IMPLANT
PACK ANTERIOR HIP CUSTOM (KITS) ×1 IMPLANT
PINNACLE PLUS 4 NEUTRAL (Hips) ×1 IMPLANT
SET HNDPC FAN SPRY TIP SCT (DISPOSABLE) ×1 IMPLANT
STAPLER VISISTAT 35W (STAPLE) IMPLANT
STRIP CLOSURE SKIN 1/2X4 (GAUZE/BANDAGES/DRESSINGS) IMPLANT
SUT ETHIBOND NAB CT1 #1 30IN (SUTURE) ×1 IMPLANT
SUT ETHILON 2 0 PS N (SUTURE) IMPLANT
SUT MNCRL AB 4-0 PS2 18 (SUTURE) IMPLANT
SUT VIC AB 0 CT1 36 (SUTURE) ×1 IMPLANT
SUT VIC AB 1 CT1 36 (SUTURE) ×1 IMPLANT
SUT VIC AB 2-0 CT1 27 (SUTURE) ×2
SUT VIC AB 2-0 CT1 TAPERPNT 27 (SUTURE) ×2 IMPLANT
TRAY FOLEY MTR SLVR 16FR STAT (SET/KITS/TRAYS/PACK) IMPLANT
YANKAUER SUCT BULB TIP NO VENT (SUCTIONS) ×1 IMPLANT

## 2022-11-04 NOTE — Anesthesia Procedure Notes (Signed)
Procedure Name: MAC Date/Time: 02-Dec-2022 4:14 PM  Performed by: Elyn Peers, CRNAPre-anesthesia Checklist: Patient identified, Emergency Drugs available, Suction available, Patient being monitored and Timeout performed Oxygen Delivery Method: Simple face mask Placement Confirmation: positive ETCO2

## 2022-11-04 NOTE — Anesthesia Preprocedure Evaluation (Addendum)
Anesthesia Evaluation  Patient identified by MRN, date of birth, ID band Patient confused    Reviewed: Allergy & Precautions, H&P , NPO status , Patient's Chart, lab work & pertinent test results  Airway Mallampati: II  TM Distance: >3 FB Neck ROM: Full    Dental no notable dental hx. (+) Edentulous Upper   Pulmonary neg pulmonary ROS   Pulmonary exam normal breath sounds clear to auscultation       Cardiovascular hypertension,  Rhythm:Regular Rate:Normal     Neuro/Psych   Anxiety    Dementia negative neurological ROS     GI/Hepatic Neg liver ROS, hiatal hernia,GERD  Medicated,,  Endo/Other  negative endocrine ROS    Renal/GU negative Renal ROS  negative genitourinary   Musculoskeletal  (+) Arthritis , Osteoarthritis,    Abdominal   Peds  Hematology negative hematology ROS (+)   Anesthesia Other Findings   Reproductive/Obstetrics negative OB ROS                             Anesthesia Physical Anesthesia Plan  ASA: 3  Anesthesia Plan: Spinal   Post-op Pain Management: Ofirmev IV (intra-op)*   Induction: Intravenous  PONV Risk Score and Plan: 3 and Propofol infusion, Ondansetron and Treatment may vary due to age or medical condition  Airway Management Planned: Natural Airway and Simple Face Mask  Additional Equipment:   Intra-op Plan:   Post-operative Plan:   Informed Consent: I have reviewed the patients History and Physical, chart, labs and discussed the procedure including the risks, benefits and alternatives for the proposed anesthesia with the patient or authorized representative who has indicated his/her understanding and acceptance.     Dental advisory given  Plan Discussed with: CRNA  Anesthesia Plan Comments:        Anesthesia Quick Evaluation

## 2022-11-04 NOTE — Progress Notes (Signed)
Patient ID: Eileen Nguyen, female   DOB: [birth year], 77 y.o.   MRN: [id number] I have examined the patient again today.  She is certainly little more sedated today and is likely dehydrated.  I did speak to husband extensively and he understands our recommendation for surgery for her hip given the fact that she ambulates regularly and given her dementia, she is certainly a risk of falling and hurting herself with just cannulated screw fixation.  She would not be able to adhere to strict nonweightbearing or touchdown weightbearing for 4 to 6 weeks which is cannulated screw fixation.  Thus, we have recommended a total hip arthroplasty given her displaced right hip femoral neck fracture.  He understands this fully and did give voiced consent and permission for surgery.  The operative right hip has been marked.

## 2022-11-04 NOTE — Assessment & Plan Note (Signed)
Vitamin D level found to be somewhat low at 25.88, can consider resuming vitamin D supplementation once patient has clinically recovered and is extubated

## 2022-11-04 NOTE — Progress Notes (Signed)
Call to patient's husband - Willis.  Notified that patient is here in Short Stay and prepping for surgery.  Discussed Checklist items, allergies, PTA meds, history, falls, apnea score with Willis and he answered to the best of his knowledge.  He is aware Dr. Sampson Goon will call to discuss anesthesia POC.  Per Anne Hahn patient and husband do not have "legal paperwork" such as HCPOA or Living Will, Conservator, museum/gallery. Due to her impared mental status which includes denial of fall, unaware of situation, agitation with intervention, and inability to answer her questions, since he is next of kin he answers.  Checklist questions answered per conversation with husband - Anne Hahn and report from 3rd floor west RN - Kennith Maes, RN. Anesthesia and OR team aware of situation.

## 2022-11-04 NOTE — Progress Notes (Addendum)
1530 Pt nauseated.   Has coughed up thick mucous.   Continued to state her abdomen " hurts" and she was "sick"; dry heaving .   1545 pt. Vomited 150 cc brownish liquid. Pt. Have very hypoactive BS noted; abd soft; but slightly tender to touch.  IV Hep lock- will not flush. Restarted IV in right forearm.   1600 vomited 50 cc again.   Informed Dr. Sampson Goon and also CRNA-Sandy Freida Busman.

## 2022-11-04 NOTE — Plan of Care (Signed)

## 2022-11-04 NOTE — Assessment & Plan Note (Signed)
· 

## 2022-11-04 NOTE — Progress Notes (Addendum)
PROGRESS NOTE   Eileen Nguyen  ZOX:[id number] DOB: 15-Apr-1946 DOA: Nov 20, 2022 PCP: Pcp, No   Date of Service: the patient was seen and examined on 11/11/2022  Brief Narrative:  77 year old female with past medical history of hypertension, gastroesophageal reflux disease, dementia who presented to [facility] emergency room 5/1 from home for evaluation of right hip pain after suffering a fall.  Upon evaluation in the emergency department patient was identified to have a new traumatic hip fracture.  Case discussed with Dr. Magnus Ivan with orthopedic surgery who agreed to see the patient in consultation.  The hospitalist group was then called to assess the patient for admission to the hospital.  Patient underwent right anterior total hip arthroplasty by Dr. Magnus Ivan on 12/02/2022.   Assessment and Plan: * Closed subcapital fracture of neck of femur, right, initial encounter [facility]) Patient seated with right total hip arthroplasty today Will continue as needed opiate-based analgesics for associated pain Vitamin D level found to be somewhat low at 25.88, initiating vitamin D supplementation PT/OT evaluation   Fall at home, initial encounter Mechanical fall suspected as patient is at high risk of falls due to underlying dementia.  Dementia with behavioral disturbance (HCC) Longstanding known history of dementia and cognitive deficit Minimizing uncomfortable stimuli Minimizing mood altering and sedating agents Encouraging family to remain at bedside is much as possible Frequent redirection by staff Fall precautions   Essential hypertension Resume patients home regimen of oral antihypertensives Titrate antihypertensive regimen as necessary to achieve adequate BP control PRN intravenous antihypertensives for excessively elevated blood pressure   GERD (gastroesophageal reflux disease) Continuing home regimen of daily PPI therapy.   Protein-calorie malnutrition, moderate  (HCC) Longstanding poor oral intake with concurrent poor muscle tone consistent with some degree of protein calorie malnutrition. Nutrition following Daily multivitamin Ensure nutritional supplement 3 times daily in between meals  Hypokalemia Replacing with potassium chloride Evaluating for concurrent hypomagnesemia  Monitoring potassium levels with serial chemistries.   Vitamin D deficiency Vitamin D 25-hydroxy level of 25.8 on 11/20/22 Initiating 50,000 units of ergocalciferol q. weekly   Subjective:  Patient is unable to answer questions appropriately due to advanced dementia.  Physical Exam:  Vitals:   11/25/2022 2000 11/25/2022 2015 11/22/2022 2036 12/01/2022 2234  BP: 131/60 (!) 115/57 117/69 (!) 155/76  Pulse: 65 79 80 75  Resp: 13 20 20 18   Temp: 97.7 F (36.5 C)  97.9 F (36.6 C) 98.2 F (36.8 C)  TempSrc:   Axillary Axillary  SpO2: 95% 100% 97% 98%  Weight:      Height:        Constitutional: Awake alert and oriented x 1, patient exhibiting intermittent bouts of agitation. Skin: Surgical dressing clean dry and intact.  Poor skin turgor in general. Eyes: Pupils are equally reactive to light.  No evidence of scleral icterus or conjunctival pallor.  ENMT: Moist mucous membranes noted.  Posterior pharynx clear of any exudate or lesions.   Respiratory: clear to auscultation bilaterally, no wheezing, no crackles. Normal respiratory effort. No accessory muscle use.  Cardiovascular: Regular rate and rhythm, no murmurs / rubs / gallops. No extremity edema. 2+ pedal pulses. No carotid bruits.  Abdomen: Abdomen is soft and nontender.  No evidence of intra-abdominal masses.  Positive bowel sounds noted in all quadrants.   Musculoskeletal: Pain with both passive and active range of motion of the right hip joint.   Data Reviewed:  I have personally reviewed and interpreted labs, imaging.  Significant findings are  CBC: Recent Labs  Lab 11/20/2022 1634 11/03/22 0806  WBC 8.3  6.0  NEUTROABS 6.2 4.2  HGB 13.9 12.8  HCT 43.7 40.1  MCV 92.2 90.5  PLT 176 186   Basic Metabolic Panel: Recent Labs  Lab 11/25/2022 1634 11/18/2022 2113 11/03/22 0806 2022-12-01 1116  NA 137  --  137 143  K 4.1  --  3.1* 3.1*  CL 106  --  105 103  CO2 21*  --  24 29  GLUCOSE 91  --  90 130*  BUN 10  --  9 13  CREATININE 0.76  --  0.73 0.77  CALCIUM 9.5  --  9.0 9.3  MG  --  1.8 2.3 2.4   GFR: Estimated Creatinine Clearance: 47.3 mL/min (by C-G formula based on SCr of 0.77 mg/dL). Liver Function Tests: Recent Labs  Lab 11/23/2022 1634 11/03/22 0806 12/01/2022 1116  AST 39 25 23  ALT 18 17 18   ALKPHOS 67 55 59  BILITOT 2.0* 1.2 0.8  PROT 7.1 6.4* 6.9  ALBUMIN 3.8 3.3* 3.6    Coagulation Profile: Recent Labs  Lab 11/03/22 0806  INR 1.1      Code Status:  Full code.  Code status   Severity of Illness:  The appropriate patient status for this patient is INPATIENT. Inpatient status is judged to be reasonable and necessary in order to provide the required intensity of service to ensure the patient's safety. The patient's presenting symptoms, physical exam findings, and initial radiographic and laboratory data in the context of their chronic comorbidities is felt to place them at high risk for further clinical deterioration. Furthermore, it is not anticipated that the patient will be medically stable for discharge from the hospital within 2 midnights of admission.   * I certify that at the point of admission it is my clinical judgment that the patient will require inpatient hospital care spanning beyond 2 midnights from the point of admission due to high intensity of service, high risk for further deterioration and high frequency of surveillance required.*  Time spent:  35 minutes  Author:  Marinda Elk MD  Dec 01, 2022 11:13 PM

## 2022-11-04 NOTE — Op Note (Signed)
Operative Note  Date of operation: 11/18/2022 Preoperative diagnosis: Right hip subcapital femoral neck fracture Postoperative diagnosis: Same  Procedure: Right direct anterior total hip arthroplasty Implants: Implant Name Type Inv. Item Serial No. Manufacturer Lot No. LRB No. Used Action  CUP SECTOR GRIPTON - [id number] Cup CUP SECTOR GRIPTON  DEPUY [employer]TH[employer] [id number] Right 1 Implanted  PINNACLE PLUS 4 NEUTRAL - [id number] Hips PINNACLE PLUS 4 NEUTRAL  DEPUY ORTHOPAEDICS [id number] Right 1 Implanted  FEM STEM 12/14 TAPER SZ 4 HIP - [id number] Orthopedic Implant FEM STEM 12/14 TAPER SZ 4 HIP  DEPUY ORTHOPAEDICS [id number] Right 1 Implanted  HEAD FEM STD 32X+5 STRL - [id number] Hips HEAD FEM STD 32X+5 Jolayne Haines [employer] [id number] Right 1 Implanted   Surgeon: Vanita Panda. Magnus Ivan, MD Assistant: Rexene Edison, PA-C  Anesthesia: Spinal EBL: 200 cc Antibiotics: 2 g IV Ancef Complications: None  Indications: The patient is a 77 year old female with dementia who sustained a mechanical fall 2 days ago injuring her right hip.  She had a nondisplaced subcapital femoral neck fracture.  However she has been trying to ambulate on this and her bone does appear osteopenic.  Cannulated screw fixation would not be appropriate given the fact that the patient would not be able to adhere to nonweightbearing status for that type of procedure and there is concerned about her being able to heal this type of fracture which is screw fixation.  Given the fact that she would not be able to adhere to any type of hip precautions or touchdown weightbearing we elected to proceed with a total hip arthroplasty.  I explained this in detail to her husband who is her POA and he does agree with this as well.  We did explain the risk of acute blood loss anemia, nerve or vessel injury, fracture, infection, DVT, dislocation, implant failure and leg length differences.  Procedure description: After informed consent was  obtained and the appropriate right hip was marked, the patient was brought to the operating room and turned on her side where spinal anesthesia was obtained.  A Foley catheter was placed and traction boots were placed on both her feet.  She was next placed supine on the Hana fracture table with a perineal post in place in both legs and inline skeletal traction vices but no traction applied.  Right operative hip assessed radiographically and it was prepped and draped with DuraPrep and sterile drapes.  Timeout was called and she was notified is correct patient correct right hip.  A small incision was then made just inferior and posterior to the ASIS and carried slightly obliquely down the leg.  Dissection was carried down to the tensor fascia lata muscle and the tensor fascia was then divided longitudinally to proceed with a direct interposed the hip.  Circumflex vessels were identified and cauterized.  The hip capsule was identified and opened up.  A hemarthrosis was encountered and then right away could see there was a femoral neck fracture.  Cobra retractors were placed around the medial and lateral femoral neck and a femoral neck cut was made just proximal to the lesser trochanter and distal to the fracture.  This was made with an oscillating saw and completed with an osteotome.  A corkscrew guide was placed in the femoral head and the femoral head was removed.  Next a bent Hohmann was placed over the medial acetabular rim and remnants of the acetabular labrum and other debris removed.  Reaming was then initiated from a size 43 reamer  and stepwise increments going up to a size 49 reamer with all reamers placed under direct visualization and the last reamer was placed under direct fluoroscopy in order to obtain the depth of reaming, the inclination and the anteversion.  The real DePuy sector GRIPTION acetabular opponent size 50 was then placed without difficulty and we decided to go with a 32+4 polythene liner to  hopefully tighten up her hip adductors.  Attention was then turned to the femur.  With the right leg externally rotated to 120 degrees, extended, and adducted, a Mueller retractor was placed by the medial calcar and a Hohmann retractor was placed behind the greater trochanter.  A box cutting osteotome was used into the femoral canal.  The lateral joint capsule was released.  Broaching was then initiated from a size 0 broach and small stepwise increments going up to a size 4 broach.  With a size 4 broach in place we trialed a standard offset femoral neck and a 32+1 trial hip ball.  This reduced into the acetabulum and based on clinical assessment we felt like we needed more offset and leg length.  We also assessed it radiographically.  We then remove the trial components and placed the real size 4 Actis femoral component but 1 with high offset and with the real 32+5 metal head ball.  Again this was reduced in the acetabulum we are pleased with her stability and tightness of that hip.  We assessed that again radiographically and mechanically.  The soft tissue was then irrigated with normal saline solution.  The joint capsule was closed with interrupted #1 Ethibond suture followed by #1 Vicryl to close the tensor fascia.  0 Vicryl was used to close the deep tissue and 2-0 Vicryl was used to close the subcutaneous tissue.  The skin was closed with staples.  An Aquacel dressing was applied.  The patient was taken off the Hana table taken recovery room in stable condition.  Rexene Edison, PA-C did assist during the entire case from beginning to end and his assistance was medically necessary and crucial for soft tissue management and retraction, helping guide implant placement and a layered closure of the wound.

## 2022-11-04 NOTE — Transfer of Care (Signed)
Immediate Anesthesia Transfer of Care Note  Patient: Eileen Nguyen  Procedure(s) Performed: TOTAL HIP ARTHROPLASTY ANTERIOR APPROACH (Right: Hip)  Patient Location: PACU  Anesthesia Type:Spinal  Level of Consciousness: drowsy and patient cooperative  Airway & Oxygen Therapy: Patient Spontanous Breathing and Patient connected to face mask oxygen  Post-op Assessment: Report given to RN and Post -op Vital signs reviewed and stable  Post vital signs: Reviewed and stable  Last Vitals:  Vitals Value Taken Time  BP 152/73 11/29/22 1745  Temp    Pulse    Resp 19 29-Nov-2022 1750  SpO2    Vitals shown include unvalidated device data.  Last Pain:  Vitals:   Nov 29, 2022 0730  TempSrc:   PainSc: Asleep         Complications: No notable events documented.

## 2022-11-04 NOTE — Anesthesia Procedure Notes (Signed)
Spinal  Patient location during procedure: OR Start time: 11/22/2022 4:12 PM End time: 12/02/2022 4:20 PM Reason for block: surgical anesthesia Staffing Performed: anesthesiologist  Anesthesiologist: Gaynelle Adu, MD Performed by: Gaynelle Adu, MD Authorized by: Gaynelle Adu, MD   Preanesthetic Checklist Completed: patient identified, IV checked, risks and benefits discussed, surgical consent, monitors and equipment checked, pre-op evaluation and timeout performed Spinal Block Patient position: right lateral decubitus Prep: DuraPrep Patient monitoring: cardiac monitor, continuous pulse ox and blood pressure Approach: midline Location: L3-4 Injection technique: single-shot Needle Needle type: Quincke  Needle gauge: 22 G Needle length: 9 cm Assessment Sensory level: T8 Events: CSF return Additional Notes Functioning IV was confirmed and monitors were applied. Sterile prep and drape, including hand hygiene and sterile gloves were used. The patient was positioned and the spine was prepped. The skin was anesthetized with lidocaine.  Free flow of clear CSF was obtained prior to injecting local anesthetic into the CSF.  The spinal needle aspirated freely following injection.  The needle was carefully withdrawn.  The patient tolerated the procedure well.

## 2022-11-05 DIAGNOSIS — S72011A Unspecified intracapsular fracture of right femur, initial encounter for closed fracture: Secondary | ICD-10-CM | POA: Diagnosis not present

## 2022-11-05 DIAGNOSIS — I10 Essential (primary) hypertension: Secondary | ICD-10-CM | POA: Diagnosis not present

## 2022-11-05 DIAGNOSIS — W19XXXA Unspecified fall, initial encounter: Secondary | ICD-10-CM | POA: Diagnosis not present

## 2022-11-05 DIAGNOSIS — F03918 Unspecified dementia, unspecified severity, with other behavioral disturbance: Secondary | ICD-10-CM | POA: Diagnosis not present

## 2022-11-05 LAB — COMPREHENSIVE METABOLIC PANEL
ALT: 16 U/L (ref 0–44)
AST: 25 U/L (ref 15–41)
Albumin: 3.2 g/dL — ABNORMAL LOW (ref 3.5–5.0)
Alkaline Phosphatase: 46 U/L (ref 38–126)
Anion gap: 11 (ref 5–15)
BUN: 18 mg/dL (ref 8–23)
CO2: 29 mmol/L (ref 22–32)
Calcium: 8.9 mg/dL (ref 8.9–10.3)
Chloride: 99 mmol/L (ref 98–111)
Creatinine, Ser: 0.84 mg/dL (ref 0.44–1.00)
GFR, Estimated: >60 mL/min (ref >60)
Glucose, Bld: 146 mg/dL — ABNORMAL HIGH (ref 70–99)
Potassium: 3.5 mmol/L (ref 3.5–5.1)
Sodium: 139 mmol/L (ref 135–145)
Total Bilirubin: 0.6 mg/dL (ref 0.3–1.2)
Total Protein: 6 g/dL — ABNORMAL LOW (ref 6.5–8.1)

## 2022-11-05 LAB — MAGNESIUM: Magnesium: 2.3 mg/dL (ref 1.7–2.4)

## 2022-11-05 MED ORDER — KCL-LACTATED RINGERS-D5W 20 MEQ/L IV SOLN
INTRAVENOUS | Status: AC
  Filled 2022-11-05 (×2): qty 1000

## 2022-11-05 MED ORDER — FENTANYL CITRATE PF 50 MCG/ML IJ SOSY: 25.0000 ug | PREFILLED_SYRINGE | INTRAMUSCULAR | Status: DC | PRN

## 2022-11-05 MED ORDER — ACETAMINOPHEN 325 MG PO TABS
650.0000 mg | ORAL_TABLET | Freq: Two times a day (BID) | ORAL | Status: DC
  Filled 2022-11-05 (×2): qty 2

## 2022-11-05 MED ORDER — FENTANYL CITRATE PF 50 MCG/ML IJ SOSY: 12.5000 ug | PREFILLED_SYRINGE | INTRAMUSCULAR | Status: DC | PRN

## 2022-11-05 MED ORDER — MELATONIN 5 MG PO TABS
10.0000 mg | ORAL_TABLET | Freq: Every day | ORAL | Status: DC
  Filled 2022-11-05: qty 2

## 2022-11-05 MED ORDER — SENNA 8.6 MG PO TABS: 2.0000 | ORAL_TABLET | Freq: Every day | ORAL | Status: DC

## 2022-11-05 MED ORDER — HYDRALAZINE HCL 20 MG/ML IJ SOLN: 10.0000 mg | Freq: Four times a day (QID) | INTRAMUSCULAR | Status: DC | PRN

## 2022-11-05 NOTE — Progress Notes (Signed)
    Patient Name: FREDDIE DEVOL           DOB: 07-12-1945  MRN: [id number]      Admission Date: 11/06/2022  Attending Provider: Marinda Elk, MD  Primary Diagnosis: Closed subcapital fracture of neck of femur, right, initial encounter [facility])   Level of care: Med-Surg    CROSS COVER NOTE   Date of Service   11/05/2022   Eli Phillips, 77 y.o. female, was admitted on 12/01/2022 for Closed subcapital fracture of neck of femur, right, initial encounter (HCC).    HPI/Events of Note   Request made by RN to change p.o. potassium to IV. Patient feels nauseated after right total hip arthroplasty procedure.  No other acute abdominal changes reported.    Interventions/ Plan   Above        Anthoney Harada, DNP, [facility] [place]

## 2022-11-05 NOTE — Progress Notes (Signed)
Patient ID: Eileen Nguyen, female   DOB: 01-16-1946, 77 y.o.   MRN: [id number] The patient did tolerate surgery yesterday for a right total hip arthroplasty to treat Eileen femoral neck fracture.  She does have dementia at baseline.  She has some mild been at home alone while Eileen Nguyen recovers from an extremity amputation and is short-term skilled nursing facility.  I have talked with him.  He understands that she will need skilled nursing herself after this hospitalization.  Eileen right hip dressing is clean and dry this morning.  She does not follow commands appropriately and is more somnolent today.  Will continue to follow Eileen from an orthopedic standpoint.  When she does get up with therapy, she can weight-bear as tolerated with no hip precautions.  This is certainly going to be a tough situation from a standpoint of Eileen dementia.

## 2022-11-05 NOTE — Plan of Care (Signed)
  Problem: Education: Goal: Knowledge of General Education information will improve Description: Including pain rating scale, medication(s)/side effects and non-pharmacologic comfort measures Outcome: Progressing   Problem: Pain Managment: Goal: General experience of comfort will improve Outcome: Progressing   Problem: Safety: Goal: Ability to remain free from injury will improve Outcome: Progressing   

## 2022-11-05 NOTE — Discharge Instructions (Signed)

## 2022-11-05 NOTE — Evaluation (Signed)
Physical Therapy Evaluation Patient Details Name: Eileen Nguyen MRN: [id number] DOB: [birth year] Today's Date: 11/05/2022  History of Present Illness  Pt admitted from home s/p fall with R hip fx and now s/p R THR by anterior approach.  Pt with hx of dementia, DDD and psoriasis  Clinical Impression  Pt admitted as above and presenting with functional mobility limitations 2* decreased R LE strength/ROM, post op pain and dementia related cognitive deficits.  Pt will benefit from follow up rehab at SNF level to maximize IND and safety.     Recommendations for follow up therapy are one component of a multi-disciplinary discharge planning process, led by the attending physician.  Recommendations may be updated based on patient status, additional functional criteria and insurance authorization.  Follow Up Recommendations       Assistance Recommended at Discharge Frequent or constant Supervision/Assistance  Patient can return home with the following  A lot of help with walking and/or transfers;A lot of help with bathing/dressing/bathroom;Assistance with cooking/housework;Assist for transportation;Help with stairs or ramp for entrance    Equipment Recommendations None recommended by PT  Recommendations for Other Services       Functional Status Assessment Patient has had a recent decline in their functional status and demonstrates the ability to make significant improvements in function in a reasonable and predictable amount of time.     Precautions / Restrictions Precautions Precautions: Fall Restrictions Weight Bearing Restrictions: No RLE Weight Bearing: Weight bearing as tolerated      Mobility  Bed Mobility Overal bed mobility: Needs Assistance Bed Mobility: Supine to Sit, Sit to Supine     Supine to sit: Total assist Sit to supine: Total assist   General bed mobility comments: Total assist with mutlimodal cues to bring pt to/from EOB sitting.  Pt able to balance at bedside  with supervision    Transfers                   General transfer comment: Not tested 2* pt's inability to follow cues    Ambulation/Gait                  Stairs            Wheelchair Mobility    Modified Rankin (Stroke Patients Only)       Balance Overall balance assessment: Needs assistance Sitting-balance support: No upper extremity supported, Feet supported Sitting balance-Leahy Scale: Fair                                       Pertinent Vitals/Pain Pain Assessment Pain Assessment: Faces Faces Pain Scale: Hurts little more Pain Location: R leg Pain Descriptors / Indicators: Grimacing, Guarding Pain Intervention(s): Limited activity within patient's tolerance, Monitored during session, Premedicated before session    Home Living Family/patient expects to be discharged to:: Unsure Living Arrangements: Alone                      Prior Function Prior Level of Function : Patient poor historian/Family not available                     Hand Dominance        Extremity/Trunk Assessment   Upper Extremity Assessment Upper Extremity Assessment: Overall WFL for tasks assessed    Lower Extremity Assessment Lower Extremity Assessment: RLE deficits/detail;Difficult to assess due to impaired cognition  Cervical / Trunk Assessment Cervical / Trunk Assessment: Kyphotic  Communication   Communication: No difficulties  Cognition Arousal/Alertness: Awake/alert Behavior During Therapy: Agitated, Impulsive Overall Cognitive Status: History of cognitive impairments - at baseline                                          General Comments      Exercises     Assessment/Plan    PT Assessment Patient needs continued PT services  PT Problem List Decreased strength;Decreased range of motion;Decreased activity tolerance;Decreased balance;Decreased mobility;Decreased cognition;Decreased knowledge of use  of DME;Pain       PT Treatment Interventions DME instruction;Gait training;Functional mobility training;Therapeutic exercise;Therapeutic activities;Patient/family education    PT Goals (Current goals can be found in the Care Plan section)  Acute Rehab PT Goals Patient Stated Goal: No specific goals expressed PT Goal Formulation: With patient Time For Goal Achievement: 11/19/22 Potential to Achieve Goals: Fair    Frequency Min 1X/week     Co-evaluation               AM-PAC PT "6 Clicks" Mobility  Outcome Measure Help needed turning from your back to your side while in a flat bed without using bedrails?: A Lot Help needed moving from lying on your back to sitting on the side of a flat bed without using bedrails?: A Lot Help needed moving to and from a bed to a chair (including a wheelchair)?: A Lot Help needed standing up from a chair using your arms (e.g., wheelchair or bedside chair)?: A Lot Help needed to walk in hospital room?: Total Help needed climbing 3-5 steps with a railing? : Total 6 Click Score: 10    End of Session Equipment Utilized During Treatment: Gait belt Activity Tolerance: Patient limited by pain Patient left: in bed;with call bell/phone within reach;with nursing/sitter in room Nurse Communication: Mobility status PT Visit Diagnosis: Difficulty in walking, not elsewhere classified (R26.2)    Time: 1610-9604 PT Time Calculation (min) (ACUTE ONLY): 14 min   Charges:   PT Evaluation $PT Eval Low Complexity: 1 Low          Mauro Kaufmann PT Acute Rehabilitation Services Pager [phone] Office [phone]   Marsden Zaino 11/05/2022, 4:33 PM

## 2022-11-05 NOTE — Progress Notes (Signed)
PROGRESS NOTE   Eileen Nguyen  ZOX:[id number] DOB: May 14, 1946 DOA: 11/09/2022 PCP: Pcp, No   Date of Service: the patient was seen and examined on 11/05/2022  Brief Narrative:  77 year old female with past medical history of hypertension, gastroesophageal reflux disease, dementia who presented to [facility] emergency room 5/1 from home for evaluation of right hip pain after suffering a fall.  Upon evaluation in the emergency department patient was identified to have a new traumatic hip fracture.  Case discussed with Dr. Magnus Ivan with orthopedic surgery who agreed to see the patient in consultation.  The hospitalist group was then called to assess the patient for admission to the hospital.  Patient underwent right anterior total hip arthroplasty by Dr. Magnus Ivan on 11/09/22.   Assessment and Plan: * Closed subcapital fracture of neck of femur, right, initial encounter [facility]) Patient underwent right total hip arthroplasty Nov 09, 2022 Providing patient with scheduled Tylenol twice daily with additional as needed opiate-based analgesics for associated pain Vitamin D level found to be somewhat low at 25.88, patient to or placed on 50,000 units of ergocalciferol q. weekly PT/OT ending skilled services in a skilled nursing facility   Fall at home, initial encounter Mechanical fall suspected as patient is at high risk of falls due to underlying dementia.  Dementia with behavioral disturbance (HCC) Substantial behavioral disturbances during this hospitalization, likely exacerbated by acute injury and pain Minimizing uncomfortable stimuli Minimizing mood altering and sedating agents Initiating bowel regimen Initiating melatonin nightly to regulate sleep cycle Frequent redirection by staff Fall precautions   Essential hypertension Blood pressures relatively well-controlled without scheduled antihypertensive therapy As needed intravenous hydralazine for markedly elevated blood  pressure   GERD (gastroesophageal reflux disease) Continuing home regimen of daily PPI therapy.   Protein-calorie malnutrition, moderate (HCC) Longstanding poor oral intake with concurrent poor muscle tone consistent with some degree of protein calorie malnutrition. Oral intake is exceedingly poor today Nutrition following Daily multivitamin Ensure nutritional supplement 3 times daily in between meals  Hypokalemia Improved with replacement Monitoring potassium levels with serial chemistries.   Vitamin D deficiency Vitamin D 25-hydroxy level of 25.8 on 5/1 Initiated 50,000 units of ergocalciferol q. weekly   Subjective:  Patient is unable to answer questions appropriately due to advanced dementia.  Physical Exam:  Vitals:   11/05/22 0624 11/05/22 0934 11/05/22 1453 11/05/22 2202  BP: (!) 147/79 135/79 128/74 (!) 137/100  Pulse: 93 85 86 (!) 110  Resp: 20 20 20 18   Temp: 97.8 F (36.6 C) 97.7 F (36.5 C) 98.2 F (36.8 C) (!) 97.3 F (36.3 C)  TempSrc: Axillary Oral Oral Oral  SpO2: 94%   95%  Weight: 51.1 kg     Height:        Constitutional: Awake alert and oriented x 1, patient not cooperative with exam  skin: Surgical dressing clean dry and intact.  Poor skin turgor in general. Eyes: Pupils are equally reactive to light.  No evidence of scleral icterus or conjunctival pallor.  ENMT: Moist mucous membranes noted.  Posterior pharynx clear of any exudate or lesions.   Respiratory: clear to auscultation bilaterally, no wheezing, no crackles. Normal respiratory effort. No accessory muscle use.  Cardiovascular: Regular rate and rhythm, no murmurs / rubs / gallops. No extremity edema. 2+ pedal pulses. No carotid bruits.  Abdomen: Abdomen is soft and nontender.  No evidence of intra-abdominal masses.  Positive bowel sounds noted in all quadrants.   Musculoskeletal: Pain with both passive and active range of motion  of the right hip joint.   Data Reviewed:  I have  personally reviewed and interpreted labs, imaging.  Significant findings are   CBC: Recent Labs  Lab 11/06/2022 1634 11/03/22 0806  WBC 8.3 6.0  NEUTROABS 6.2 4.2  HGB 13.9 12.8  HCT 43.7 40.1  MCV 92.2 90.5  PLT 176 186   Basic Metabolic Panel: Recent Labs  Lab 11/22/2022 1634 11/24/2022 2113 11/03/22 0806 11/30/2022 1116 11/05/22 0950  NA 137  --  137 143 139  K 4.1  --  3.1* 3.1* 3.5  CL 106  --  105 103 99  CO2 21*  --  24 29 29   GLUCOSE 91  --  90 130* 146*  BUN 10  --  9 13 18   CREATININE 0.76  --  0.73 0.77 0.84  CALCIUM 9.5  --  9.0 9.3 8.9  MG  --  1.8 2.3 2.4 2.3   GFR: Estimated Creatinine Clearance: 45.1 mL/min (by C-G formula based on SCr of 0.84 mg/dL). Liver Function Tests: Recent Labs  Lab 11/08/2022 1634 11/03/22 0806 2022-11-30 1116 11/05/22 0950  AST 39 25 23 25   ALT 18 17 18 16   ALKPHOS 67 55 59 46  BILITOT 2.0* 1.2 0.8 0.6  PROT 7.1 6.4* 6.9 6.0*  ALBUMIN 3.8 3.3* 3.6 3.2*    Coagulation Profile: Recent Labs  Lab 11/03/22 0806  INR 1.1      Code Status:  Full code.  Code status   Severity of Illness:  The appropriate patient status for this patient is INPATIENT. Inpatient status is judged to be reasonable and necessary in order to provide the required intensity of service to ensure the patient's safety. The patient's presenting symptoms, physical exam findings, and initial radiographic and laboratory data in the context of their chronic comorbidities is felt to place them at high risk for further clinical deterioration. Furthermore, it is not anticipated that the patient will be medically stable for discharge from the hospital within 2 midnights of admission.   * I certify that at the point of admission it is my clinical judgment that the patient will require inpatient hospital care spanning beyond 2 midnights from the point of admission due to high intensity of service, high risk for further deterioration and high frequency of surveillance  required.*  Time spent:  37 minutes  Author:  Marinda Elk MD  11/05/2022 10:22 PM

## 2022-11-06 ENCOUNTER — Inpatient Hospital Stay (HOSPITAL_COMMUNITY): Payer: 59

## 2022-11-06 DIAGNOSIS — E559 Vitamin D deficiency, unspecified: Secondary | ICD-10-CM

## 2022-11-06 DIAGNOSIS — I468 Cardiac arrest due to other underlying condition: Secondary | ICD-10-CM | POA: Diagnosis present

## 2022-11-06 DIAGNOSIS — J989 Respiratory disorder, unspecified: Secondary | ICD-10-CM | POA: Diagnosis not present

## 2022-11-06 DIAGNOSIS — F03918 Unspecified dementia, unspecified severity, with other behavioral disturbance: Secondary | ICD-10-CM | POA: Diagnosis not present

## 2022-11-06 DIAGNOSIS — S72011A Unspecified intracapsular fracture of right femur, initial encounter for closed fracture: Secondary | ICD-10-CM | POA: Diagnosis not present

## 2022-11-06 DIAGNOSIS — Z7189 Other specified counseling: Secondary | ICD-10-CM

## 2022-11-06 DIAGNOSIS — W19XXXA Unspecified fall, initial encounter: Secondary | ICD-10-CM | POA: Diagnosis not present

## 2022-11-06 LAB — COMPREHENSIVE METABOLIC PANEL
ALT: 20 U/L (ref 0–44)
ALT: 1263 U/L — ABNORMAL HIGH (ref 0–44)
ALT: 1480 U/L — ABNORMAL HIGH (ref 0–44)
AST: 41 U/L (ref 15–41)
AST: 2690 U/L — ABNORMAL HIGH (ref 15–41)
AST: 3775 U/L — ABNORMAL HIGH (ref 15–41)
Albumin: 3.1 g/dL — ABNORMAL LOW (ref 3.5–5.0)
Albumin: 1.5 g/dL — ABNORMAL LOW (ref 3.5–5.0)
Albumin: 1.6 g/dL — ABNORMAL LOW (ref 3.5–5.0)
Alkaline Phosphatase: 49 U/L (ref 38–126)
Alkaline Phosphatase: 55 U/L (ref 38–126)
Alkaline Phosphatase: 64 U/L (ref 38–126)
Anion gap: 12 (ref 5–15)
Anion gap: 18 — ABNORMAL HIGH (ref 5–15)
Anion gap: 23 — ABNORMAL HIGH (ref 5–15)
BUN: 31 mg/dL — ABNORMAL HIGH (ref 8–23)
BUN: 34 mg/dL — ABNORMAL HIGH (ref 8–23)
BUN: 39 mg/dL — ABNORMAL HIGH (ref 8–23)
CO2: 32 mmol/L (ref 22–32)
CO2: 15 mmol/L — ABNORMAL LOW (ref 22–32)
CO2: 16 mmol/L — ABNORMAL LOW (ref 22–32)
Calcium: 9.4 mg/dL (ref 8.9–10.3)
Calcium: 6.1 mg/dL — CL (ref 8.9–10.3)
Calcium: 8 mg/dL — ABNORMAL LOW (ref 8.9–10.3)
Chloride: 98 mmol/L (ref 98–111)
Chloride: 111 mmol/L (ref 98–111)
Chloride: 103 mmol/L (ref 98–111)
Creatinine, Ser: 1.55 mg/dL — ABNORMAL HIGH (ref 0.44–1.00)
Creatinine, Ser: 1.71 mg/dL — ABNORMAL HIGH (ref 0.44–1.00)
Creatinine, Ser: 2.07 mg/dL — ABNORMAL HIGH (ref 0.44–1.00)
GFR, Estimated: 35 mL/min — ABNORMAL LOW (ref >60)
GFR, Estimated: 31 mL/min — ABNORMAL LOW (ref >60)
GFR, Estimated: 24 mL/min — ABNORMAL LOW (ref >60)
Glucose, Bld: 137 mg/dL — ABNORMAL HIGH (ref 70–99)
Glucose, Bld: 192 mg/dL — ABNORMAL HIGH (ref 70–99)
Glucose, Bld: 186 mg/dL — ABNORMAL HIGH (ref 70–99)
Potassium: 3.2 mmol/L — ABNORMAL LOW (ref 3.5–5.1)
Potassium: 3.4 mmol/L — ABNORMAL LOW (ref 3.5–5.1)
Potassium: 3.8 mmol/L (ref 3.5–5.1)
Sodium: 142 mmol/L (ref 135–145)
Sodium: 144 mmol/L (ref 135–145)
Sodium: 142 mmol/L (ref 135–145)
Total Bilirubin: 0.9 mg/dL (ref 0.3–1.2)
Total Bilirubin: 1.5 mg/dL — ABNORMAL HIGH (ref 0.3–1.2)
Total Bilirubin: 1.8 mg/dL — ABNORMAL HIGH (ref 0.3–1.2)
Total Protein: 6.5 g/dL (ref 6.5–8.1)
Total Protein: 3.7 g/dL — ABNORMAL LOW (ref 6.5–8.1)
Total Protein: 3.7 g/dL — ABNORMAL LOW (ref 6.5–8.1)

## 2022-11-06 LAB — CBC WITH DIFFERENTIAL/PLATELET
Abs Immature Granulocytes: 0.14 10*3/uL — ABNORMAL HIGH (ref 0.00–0.07)
Abs Immature Granulocytes: 0.34 10*3/uL — ABNORMAL HIGH (ref 0.00–0.07)
Basophils Absolute: 0 10*3/uL (ref 0.0–0.1)
Basophils Absolute: 0 10*3/uL (ref 0.0–0.1)
Basophils Relative: 0 %
Basophils Relative: 1 %
Eosinophils Absolute: 0 10*3/uL (ref 0.0–0.5)
Eosinophils Absolute: 0 10*3/uL (ref 0.0–0.5)
Eosinophils Relative: 0 %
Eosinophils Relative: 0 %
HCT: 40.7 % (ref 36.0–46.0)
HCT: 29.9 % — ABNORMAL LOW (ref 36.0–46.0)
Hemoglobin: 12.5 g/dL (ref 12.0–15.0)
Hemoglobin: 9.1 g/dL — ABNORMAL LOW (ref 12.0–15.0)
Immature Granulocytes: 1 %
Immature Granulocytes: 5 %
Lymphocytes Relative: 5 %
Lymphocytes Relative: 20 %
Lymphs Abs: 1 10*3/uL (ref 0.7–4.0)
Lymphs Abs: 1.4 10*3/uL (ref 0.7–4.0)
MCH: 28.4 pg (ref 26.0–34.0)
MCH: 29.4 pg (ref 26.0–34.0)
MCHC: 30.7 g/dL (ref 30.0–36.0)
MCHC: 30.4 g/dL (ref 30.0–36.0)
MCV: 92.5 fL (ref 80.0–100.0)
MCV: 96.5 fL (ref 80.0–100.0)
Monocytes Absolute: 2.2 10*3/uL — ABNORMAL HIGH (ref 0.1–1.0)
Monocytes Absolute: 0.2 10*3/uL (ref 0.1–1.0)
Monocytes Relative: 11 %
Monocytes Relative: 3 %
Neutro Abs: 15.8 10*3/uL — ABNORMAL HIGH (ref 1.7–7.7)
Neutro Abs: 5.1 10*3/uL (ref 1.7–7.7)
Neutrophils Relative %: 83 %
Neutrophils Relative %: 71 %
Platelets: 263 10*3/uL (ref 150–400)
Platelets: 150 10*3/uL (ref 150–400)
RBC: 4.4 MIL/uL (ref 3.87–5.11)
RBC: 3.1 MIL/uL — ABNORMAL LOW (ref 3.87–5.11)
RDW: 13.8 % (ref 11.5–15.5)
RDW: 13.7 % (ref 11.5–15.5)
WBC: 19.2 10*3/uL — ABNORMAL HIGH (ref 4.0–10.5)
WBC: 7.2 10*3/uL (ref 4.0–10.5)
nRBC: 0 % (ref 0.0–0.2)
nRBC: 0.4 % — ABNORMAL HIGH (ref 0.0–0.2)

## 2022-11-06 LAB — CBC
HCT: 32.7 % — ABNORMAL LOW (ref 36.0–46.0)
Hemoglobin: 9.8 g/dL — ABNORMAL LOW (ref 12.0–15.0)
MCH: 29.3 pg (ref 26.0–34.0)
MCHC: 30 g/dL (ref 30.0–36.0)
MCV: 97.9 fL (ref 80.0–100.0)
Platelets: 149 10*3/uL — ABNORMAL LOW (ref 150–400)
RBC: 3.34 MIL/uL — ABNORMAL LOW (ref 3.87–5.11)
RDW: 13.9 % (ref 11.5–15.5)
WBC: 4.9 10*3/uL (ref 4.0–10.5)
nRBC: 1 % — ABNORMAL HIGH (ref 0.0–0.2)

## 2022-11-06 LAB — BLOOD GAS, ARTERIAL
Acid-base deficit: 11.5 mmol/L — ABNORMAL HIGH (ref 0.0–2.0)
Acid-base deficit: 11 mmol/L — ABNORMAL HIGH (ref 0.0–2.0)
Bicarbonate: 16 mmol/L — ABNORMAL LOW (ref 20.0–28.0)
Bicarbonate: 17.2 mmol/L — ABNORMAL LOW (ref 20.0–28.0)
Drawn by: 20012
Drawn by: 270211
FIO2: 100 %
FIO2: 100 %
MECHVT: 400 mL
MECHVT: 400 mL
O2 Content: 100 L/min
O2 Saturation: 100 %
O2 Saturation: 95.9 %
PEEP: 5 cmH2O
PEEP: 5 cmH2O
Patient temperature: 36.1
Patient temperature: 36.3
RATE: 24 resp/min
RATE: 24 resp/min
pCO2 arterial: 39 mmHg (ref 32–48)
pCO2 arterial: 45 mmHg (ref 32–48)
pH, Arterial: 7.21 — ABNORMAL LOW (ref 7.35–7.45)
pH, Arterial: 7.19 — CL (ref 7.35–7.45)
pO2, Arterial: 296 mmHg — ABNORMAL HIGH (ref 83–108)
pO2, Arterial: 83 mmHg (ref 83–108)

## 2022-11-06 LAB — LACTIC ACID, PLASMA
Lactic Acid, Venous: >9 mmol/L (ref 0.5–1.9)
Lactic Acid, Venous: >9 mmol/L (ref 0.5–1.9)

## 2022-11-06 LAB — GLUCOSE, CAPILLARY: Glucose-Capillary: 158 mg/dL — ABNORMAL HIGH (ref 70–99)

## 2022-11-06 LAB — TROPONIN I (HIGH SENSITIVITY)
Troponin I (High Sensitivity): >24000 ng/L (ref <18)
Troponin I (High Sensitivity): >24000 ng/L (ref <18)

## 2022-11-06 LAB — MAGNESIUM
Magnesium: 2.5 mg/dL — ABNORMAL HIGH (ref 1.7–2.4)
Magnesium: 2.4 mg/dL (ref 1.7–2.4)

## 2022-11-06 LAB — MRSA NEXT GEN BY PCR, NASAL: MRSA by PCR Next Gen: DETECTED — AB

## 2022-11-06 MED ORDER — SODIUM CHLORIDE 0.9 % IV SOLN
3.0000 g | Freq: Two times a day (BID) | INTRAVENOUS | Status: DC
  Administered 2022-11-06: 3 g via INTRAVENOUS
  Filled 2022-11-06: qty 8

## 2022-11-06 MED ORDER — HYDROCORTISONE SOD SUC (PF) 100 MG IJ SOLR
50.0000 mg | Freq: Four times a day (QID) | INTRAMUSCULAR | Status: DC
  Administered 2022-11-06 – 2022-11-07 (×3): 50 mg via INTRAVENOUS
  Filled 2022-11-06 (×3): qty 2

## 2022-11-06 MED ORDER — NITROGLYCERIN 2 % TD OINT
1.0000 [in_us] | TOPICAL_OINTMENT | Freq: Three times a day (TID) | TRANSDERMAL | Status: DC
Start: 2022-11-06 — End: 2022-11-06

## 2022-11-06 MED ORDER — LACTATED RINGERS IV BOLUS
1000.0000 mL | Freq: Once | INTRAVENOUS | Status: AC
  Administered 2022-11-06: 1000 mL via INTRAVENOUS

## 2022-11-06 MED ORDER — VASOPRESSIN 20 UNITS/100 ML INFUSION FOR SHOCK
0.0000 [IU]/min | INTRAVENOUS | Status: DC
  Administered 2022-11-06 – 2022-11-07 (×2): 0.03 [IU]/min via INTRAVENOUS
  Filled 2022-11-06 (×2): qty 100

## 2022-11-06 MED ORDER — NOREPINEPHRINE 4 MG/250ML-% IV SOLN: 2.0000 ug/min | INTRAVENOUS | Status: DC

## 2022-11-06 MED ORDER — PHENTOLAMINE MESYLATE 5 MG IJ SOLR
5.0000 mg | Freq: Once | INTRAMUSCULAR | Status: AC
  Administered 2022-11-06: 5 mg via SUBCUTANEOUS
  Filled 2022-11-06: qty 5

## 2022-11-06 MED ORDER — SODIUM BICARBONATE 8.4 % IV SOLN
INTRAVENOUS | Status: DC
  Filled 2022-11-06: qty 150
  Filled 2022-11-06 (×2): qty 1000

## 2022-11-06 MED ORDER — FENTANYL CITRATE PF 50 MCG/ML IJ SOSY
25.0000 ug | PREFILLED_SYRINGE | INTRAMUSCULAR | Status: DC | PRN
  Administered 2022-11-06: 50 ug via INTRAVENOUS
  Administered 2022-11-06: 100 ug via INTRAVENOUS
  Administered 2022-11-07: 50 ug via INTRAVENOUS
  Filled 2022-11-06: qty 1
  Filled 2022-11-06 (×2): qty 2
  Filled 2022-11-06: qty 1

## 2022-11-06 MED ORDER — CHLORHEXIDINE GLUCONATE CLOTH 2 % EX PADS
6.0000 | MEDICATED_PAD | Freq: Every day | CUTANEOUS | Status: DC
  Administered 2022-11-06: 6 via TOPICAL

## 2022-11-06 MED ORDER — DOCUSATE SODIUM 50 MG/5ML PO LIQD: 100.0000 mg | Freq: Two times a day (BID) | ORAL | Status: DC

## 2022-11-06 MED ORDER — PANTOPRAZOLE SODIUM 40 MG IV SOLR: 40.0000 mg | Freq: Two times a day (BID) | INTRAVENOUS | Status: DC

## 2022-11-06 MED ORDER — MIDAZOLAM HCL 2 MG/2ML IJ SOLN
1.0000 mg | INTRAMUSCULAR | Status: DC | PRN
  Filled 2022-11-06: qty 2

## 2022-11-06 MED ORDER — CALCIUM GLUCONATE-NACL 2-0.675 GM/100ML-% IV SOLN
2.0000 g | Freq: Once | INTRAVENOUS | Status: AC
  Administered 2022-11-06: 2000 mg via INTRAVENOUS
  Filled 2022-11-06: qty 100

## 2022-11-06 MED ORDER — POLYETHYLENE GLYCOL 3350 17 G PO PACK: 17.0000 g | PACK | Freq: Every day | ORAL | Status: DC

## 2022-11-06 MED ORDER — NOREPINEPHRINE 4 MG/250ML-% IV SOLN: 0.0000 ug/min | INTRAVENOUS | Status: DC

## 2022-11-06 MED ORDER — NOREPINEPHRINE 4 MG/250ML-% IV SOLN
0.0000 ug/min | INTRAVENOUS | Status: DC
  Administered 2022-11-06: 25 ug/min via INTRAVENOUS
  Administered 2022-11-06 – 2022-11-07 (×6): 40 ug/min via INTRAVENOUS
  Administered 2022-11-07: 28 ug/min via INTRAVENOUS
  Filled 2022-11-06 (×10): qty 250

## 2022-11-06 MED ORDER — PANTOPRAZOLE SODIUM 40 MG IV SOLR
40.0000 mg | Freq: Two times a day (BID) | INTRAVENOUS | Status: DC
  Administered 2022-11-06 – 2022-11-07 (×3): 40 mg via INTRAVENOUS
  Filled 2022-11-06 (×3): qty 10

## 2022-11-06 MED ORDER — SODIUM CHLORIDE 0.9 % IV SOLN
250.0000 mL | INTRAVENOUS | Status: DC
  Administered 2022-11-06: 250 mL via INTRAVENOUS

## 2022-11-06 MED ORDER — FENTANYL CITRATE PF 50 MCG/ML IJ SOSY: 25.0000 ug | PREFILLED_SYRINGE | INTRAMUSCULAR | Status: DC | PRN

## 2022-11-06 MED FILL — Medication: Qty: 1 | Status: AC

## 2022-11-06 NOTE — Procedures (Signed)
Central Venous Catheter Insertion Procedure Note  Eileen Nguyen  [id number]  1945-12-17  Date:11/06/22  Time:4:07 PM   Provider Performing:Mischa Pollard   Procedure: Insertion of Non-tunneled Central Venous Catheter(36556) with US guidance (41660)   Indication(s) Difficult access  Consent Unable to obtain consent due to emergent nature of procedure.  Anesthesia Topical only with 1% lidocaine   Timeout Verified patient identification, verified procedure, site/side was marked, verified correct patient position, special equipment/implants available, medications/allergies/relevant history reviewed, required imaging and test results available.  Sterile Technique Maximal sterile technique including full sterile barrier drape, hand hygiene, sterile gown, sterile gloves, mask, hair covering, sterile ultrasound probe cover (if used).  Procedure Description Area of catheter insertion was cleaned with chlorhexidine and draped in sterile fashion. [place] used to indentify anatomy of the blood vessel and guide needle in realtime. With real-time ultrasound guidance a central venous catheter was placed into the left internal jugular vein. Nonpulsatile blood flow and easy flushing noted in all ports.  The catheter was sutured in place and sterile dressing applied.  Complications/Tolerance None; patient tolerated the procedure well. Chest X-ray is ordered to verify placement for internal jugular or subclavian cannulation.   Chest x-ray is not ordered for femoral cannulation.  EBL Minimal  Specimen(s) None     Chilton Greathouse MD [facility] 11/06/2022, 4:08 PM

## 2022-11-06 NOTE — Progress Notes (Addendum)
PROGRESS NOTE   Eileen Nguyen  ZOX:[id number] DOB: 1946-02-26 DOA: 11/26/2022 PCP: Pcp, No   Date of Service: the patient was seen and examined on 11/06/2022  Brief Narrative:  77 year old female with past medical history of hypertension, gastroesophageal reflux disease, dementia who presented to [facility] emergency room 5/1 from home for evaluation of right hip pain after suffering a fall.  Upon evaluation in the emergency department patient was identified to have a new traumatic hip fracture.  Case discussed with Dr. Magnus Ivan with orthopedic surgery who agreed to see the patient in consultation.  The hospitalist group was then called to assess the patient for admission to the hospital.  Patient underwent right anterior total hip arthroplasty by Dr. Magnus Ivan on 11/03/2022.  Patient unfortunately began to vomit dark feculent appearing material the afternoon of 5/5 with immediate development of respiratory arrest followed by cardiac arrest.  Patient status post intubation and transferred to the intensive care unit.  Dr. Isaiah Serge with PCCM.   Assessment and Plan: * Cardiac arrest due to respiratory disorder [facility]) Patient began to vomit what appears to be feculent material right before developing respiratory and cardiac arrest Patient underwent approximately 20 minutes of cumulative ACLS including 5 doses of epinephrine, 2 shocks, bicarbonate, D50 before regaining a pulse. Patient requiring Levophed infusion due to severe hypotension Aspiration leading to hypoxic respiratory failure is likely the etiology, likely secondary to a postoperative ileus or obstruction Patient is status post intubation and initiation of mechanical ventilation has now been transferred to the [facility] service.  Closed subcapital fracture of neck of femur, right, initial encounter [facility]) Patient underwent right total hip arthroplasty 5/3 with Dr. Magnus Ivan with orthopedic surgery Vitamin D level found to be somewhat  low at 25.88, can consider resuming vitamin D supplementation once patient has clinically recovered and is extubated  Eventual plan is for patient to receive physical therapy in a skilled nursing facility   Fall at home, initial encounter Mechanical fall suspected   Dementia with behavioral disturbance (HCC) Substantial behavioral disturbances during this hospitalization, likely exacerbated by acute injury and pain As patient clinically recovers and gets of chemical ventilation delirium more likely prove to be quite formidable  Essential hypertension Holding antihypertensives in light of development of cardiac arrest   GERD (gastroesophageal reflux disease) Protonix 40 mg IV every 12 hours due to dark, feculent appearing vomitus prior to cardiac arrest   Protein-calorie malnutrition, moderate (HCC) Longstanding poor oral intake with concurrent poor muscle tone consistent with some degree of protein calorie malnutrition.   Now patient is intubated, will need to reengage nutrition for likely initiation of tube feeds once patient is hemodynamically stable.  Hypokalemia Recurrent and will need to be replaced Monitoring potassium levels with serial chemistries.   Vitamin D deficiency Vitamin D level found to be somewhat low at 25.88, can consider resuming vitamin D supplementation once patient has clinically recovered and is extubated   Goals of care, counseling/discussion After lengthy goals of care discussion with husband after episode of cardiac arrest, husband is agreed that the patient goes into cardiac arrest again efforts to revive her would be futile and has a made a decision to make the patient DNR.   Subjective:  Patient is unable to answer questions appropriately due to advanced dementia.  Physical Exam:  Vitals:   11/05/22 0934 11/05/22 1453 11/05/22 2202 11/06/22 0619  BP: 135/79 128/74 (!) 137/100 (!) 125/100  Pulse: 85 86 (!) 110 78  Resp: 20 20 18  17  Temp:  97.7 F (36.5 C) 98.2 F (36.8 C) (!) 97.3 F (36.3 C)   TempSrc: Oral Oral Oral   SpO2:   95%   Weight:      Height:        Constitutional: Patient intubated and not responding to multiple painful stimuli.   Skin: Surgical dressing clean dry and intact.  Poor skin turgor in general. Eyes: Pupils minimally reactive.  no evidence of scleral icterus or conjunctival pallor.  ENMT: ET tube in place  Respiratory: Scattered rhonchi bilaterally with basilar and mid field rales  cardiovascular: Regular rate and rhythm, no murmurs / rubs / gallops. No extremity edema.  Notably thready pulse.  No carotid bruits.  Abdomen: Abdomen is soft and nontender.  No evidence of intra-abdominal masses.  Positive bowel sounds noted in all quadrants.   Musculoskeletal: No deformities noted.  Data Reviewed:  I have personally reviewed and interpreted labs, imaging.  Significant findings are   CBC: Recent Labs  Lab 11/22/2022 1634 11/03/22 0806 11/06/22 0845  WBC 8.3 6.0 19.2*  NEUTROABS 6.2 4.2 15.8*  HGB 13.9 12.8 12.5  HCT 43.7 40.1 40.7  MCV 92.2 90.5 92.5  PLT 176 186 263   Basic Metabolic Panel: Recent Labs  Lab 11/13/2022 1634 12/02/2022 2113 11/03/22 0806 11/12/2022 1116 11/05/22 0950 11/06/22 0845  NA 137  --  137 143 139 142  K 4.1  --  3.1* 3.1* 3.5 3.2*  CL 106  --  105 103 99 98  CO2 21*  --  24 29 29  32  GLUCOSE 91  --  90 130* 146* 137*  BUN 10  --  9 13 18  31*  CREATININE 0.76  --  0.73 0.77 0.84 1.55*  CALCIUM 9.5  --  9.0 9.3 8.9 9.4  MG  --  1.8 2.3 2.4 2.3 2.5*   GFR: Estimated Creatinine Clearance: 24.4 mL/min (A) (by C-G formula based on SCr of 1.55 mg/dL (H)). Liver Function Tests: Recent Labs  Lab 11/13/2022 1634 11/03/22 0806 11/09/2022 1116 11/05/22 0950 11/06/22 0845  AST 39 25 23 25  41  ALT 18 17 18 16 20   ALKPHOS 67 55 59 46 49  BILITOT 2.0* 1.2 0.8 0.6 0.9  PROT 7.1 6.4* 6.9 6.0* 6.5  ALBUMIN 3.8 3.3* 3.6 3.2* 3.1*    Coagulation Profile: Recent  Labs  Lab 11/03/22 0806  INR 1.1      Code Status:  Full code.  Code status   CRITICAL CARE ATTESTATION:  Patient is significant risk morbidity mortality due to cardiorespiratory arrest requiring prolonged efforts to achieve ROSC, endotracheal intubation, initiation of mechanical ventilation, titration of vasopressors, administration of epinephrine intravenous fluids as well as discussions with family.  Critical care time spent 70 minutes.  Author:  Marinda Elk MD  11/06/2022 3:43 PM

## 2022-11-06 NOTE — Assessment & Plan Note (Addendum)
Patient began to vomit what appears to be feculent material right before developing respiratory and cardiac arrest Patient underwent approximately 20 minutes of cumulative ACLS including 5 doses of epinephrine, 2 shocks, bicarbonate, D50 before regaining a pulse. Patient requiring Levophed infusion due to severe hypotension Aspiration leading to hypoxic respiratory failure is likely the etiology, likely secondary to a postoperative ileus or obstruction Patient is status post intubation and initiation of mechanical ventilation has now been transferred to the [facility] service.

## 2022-11-06 NOTE — Assessment & Plan Note (Signed)
After lengthy goals of care discussion with husband after episode of cardiac arrest, husband is agreed that the patient goes into cardiac arrest again efforts to revive her would be futile and has a made a decision to make the patient DNR.

## 2022-11-06 NOTE — Anesthesia Postprocedure Evaluation (Signed)
Anesthesia Post Note  Patient: Eileen Nguyen  Procedure(s) Performed: TOTAL HIP ARTHROPLASTY ANTERIOR APPROACH (Right: Hip)     Patient location during evaluation: PACU Anesthesia Type: Spinal Level of consciousness: confused Pain management: pain level controlled Vital Signs Assessment: post-procedure vital signs reviewed and stable Respiratory status: spontaneous breathing, respiratory function stable and patient connected to nasal cannula oxygen Cardiovascular status: blood pressure returned to baseline and stable Postop Assessment: no headache, no backache, no apparent nausea or vomiting, spinal receding and patient able to bend at knees Anesthetic complications: no  No notable events documented.  Last Vitals:  Vitals:   11/05/22 2202 11/06/22 0619  BP: (!) 137/100 (!) 125/100  Pulse: (!) 110 78  Resp: 18 17  Temp: (!) 36.3 C   SpO2: 95%     Last Pain:  Vitals:   11/05/22 2202  TempSrc: Oral  PainSc:                  Demetra Moya,W. EDMOND

## 2022-11-06 NOTE — ED Provider Notes (Addendum)
Physical Exam  BP (!) 125/100 (BP Location: Right Arm)   Pulse 78   Temp (!) 97.3 F (36.3 C) (Oral)   Resp 17   Ht 5\' 2"  (1.575 m)   Wt 51.1 kg   SpO2 95%   BMI 20.60 kg/m     Procedures  .Critical Care  Performed by: Ernie Avena, MD Authorized by: Ernie Avena, MD   Critical care provider statement:    Critical care time (minutes):  30   Critical care was necessary to treat or prevent imminent or life-threatening deterioration of the following conditions:  Cardiac failure, circulatory failure and respiratory failure   Critical care was time spent personally by me on the following activities:  Development of treatment plan with patient or surrogate, discussions with consultants, evaluation of patient's response to treatment, examination of patient, ordering and review of laboratory studies, ordering and review of radiographic studies, ordering and performing treatments and interventions, pulse oximetry, re-evaluation of patient's condition and review of old charts Procedure Name: Intubation Date/Time: 11/06/2022 3:07 PM  Performed by: Ernie Avena, MDPre-anesthesia Checklist: Patient identified, Patient being monitored, Emergency Drugs available, Timeout performed and Suction available Oxygen Delivery Method: Non-rebreather mask Preoxygenation: Pre-oxygenation with 100% oxygen Induction Type: Rapid sequence Ventilation: Mask ventilation with difficulty Laryngoscope Size: Glidescope and 3 Grade View: Grade II Tube size: 7.5 mm Number of attempts: 1 Placement Confirmation: ETT inserted through vocal cords under direct vision, CO2 detector and Breath sounds checked- equal and bilateral Secured at: 23 cm Tube secured with: ETT holder Comments: Difficulty with mask ventilation as the patient was actively vomiting feculent material and actively being suctioned.    CPR  Date/Time: 11/06/2022 3:08 PM  Performed by: Ernie Avena, MD Authorized by: Ernie Avena, MD  CPR  Procedure Details:    Outcome: ROSC obtained    CPR performed via ACLS guidelines under my direct supervision.  See RN documentation for details including defibrillator use, medications, doses and timing.   ED Course / MDM    Medical Decision Making Amount and/or Complexity of Data Reviewed Labs: ordered. Radiology: ordered.  Risk Decision regarding hospitalization.    I was called to the floor to respond to a CODE BLUE.  Briefly, this is a 77 year old female who is postoperative day 2 after a right anterior total hip arthroplasty who was found in her bed unresponsive, vomiting feculent material.  Concern for postoperative ileus/obstruction and acute respiratory failure due to massive aspiration.  CPR had been initiated by Dr. Martyn Malay, please see his note for additional MDM.  On arrival, CPR was in progress, patient actively being suctioned by RT and attempts were made at bag-valve-mask ventilation with active emesis of feculent material.  The patient was subsequently endotracheally intubated, confirmed with ET tube visualized through the cords, bilateral breath sounds and positive color change.  CPR was continued, patient was intermittently found to be in PEA, shocked for V. tach x 2, chest compressions administered in addition to epinephrine per ACLS protocol, administered sodium bicarbonate x2 until ROSC was obtained.  The patient then went into V-fib and lost pulses and CPR was resumed. She was shocked for V-fib x 1 until ROSC was achieved a second time.  She maintained ROSC for 5 minutes, pulse oximetry notably 90% while being actively bagged by RT.  Care of the patient left in the hands of Dr. Martyn Malay who will transfer the patient emergently to the ICU for continued management.        Ernie Avena, MD 11/06/22 [phone]

## 2022-11-06 NOTE — Progress Notes (Signed)
Pt very agitated, frequently putting fingers down her throat to make herself vomit.  I and the sitter have frequently attempted to stop her and encourage her to stop doing this. She is also requesting water or Pepsi, however, after being given PO fluids, the patient will either spit fluid in the floor or just spit in the bed. She also is frequently attempting to get OOB and is too weak to do so on her own. Request sent to Anthoney Harada, NP for anti-anxiety meds.  She encouraged [place] to try using the scheduled Melatonin instead.   Attempted to have pt. Take HS meds, she spit out the small amount attempted, then refused all attempts to give meds after. The patient is either unable or unwilling to follow simple commands from the sitter and the RN. Bed alarm on.  The patient does not appear to be in any acute distress.

## 2022-11-06 NOTE — IPAL (Signed)
  Interdisciplinary Goals of Care Family Meeting   Date carried out: 11/06/2022  Location of the meeting: Phone conference  Member's involved: Physician and Other: Husband who is decision maker.  Durable Power of Insurance risk surveyor: Environmental health practitioner.   Discussion: We discussed goals of care for Eileen Nguyen .  Husband informed about patient's prolonged episode of cardiac arrest.  Husband educated on the impact of patient's advanced age and comorbidities including advanced dementia.    Because of this and now prolonged episode of cardiac arrest husband is agreed that the patient goes into cardiac arrest again efforts to revive her areunlikely to be successful and has a made a decision to make the patient DNR.  Code status:   Code Status: DNR   Disposition: SNF/LTAC  Time spent for the meeting: 20 min    Marinda Elk, MD  11/06/2022, 3:43 PM

## 2022-11-06 NOTE — Procedures (Signed)
Arterial Catheter Insertion Procedure Note  Eileen Nguyen  [id number]  [birth year]  Date:11/06/22  Time:4:52 PM    Provider Performing: Chilton Greathouse    Procedure: Insertion of Arterial Line (40981) with US guidance (19147)   Indication(s) Blood pressure monitoring and/or need for frequent ABGs  Consent Risks of the procedure as well as the alternatives and risks of each were explained to the patient and/or caregiver.  Consent for the procedure was obtained and is signed in the bedside chart  Anesthesia None   Time Out Verified patient identification, verified procedure, site/side was marked, verified correct patient position, special equipment/implants available, medications/allergies/relevant history reviewed, required imaging and test results available.   Sterile Technique Maximal sterile technique including full sterile barrier drape, hand hygiene, sterile gown, sterile gloves, mask, hair covering, sterile ultrasound probe cover (if used).   Procedure Description Area of catheter insertion was cleaned with chlorhexidine and draped in sterile fashion. With real-time ultrasound guidance an arterial catheter was placed into the right femoral artery.  Appropriate arterial tracings confirmed on monitor.     Complications/Tolerance None; patient tolerated the procedure well.   EBL Minimal   Specimen(s) None  Chilton Greathouse MD [facility] 11/06/2022, 4:53 PM

## 2022-11-06 NOTE — Progress Notes (Signed)
eLink Physician-Brief Progress Note Patient Name: Eileen Nguyen DOB: [birth year] MRN: [id number]   Date of Service  11/06/2022  HPI/Events of Note  Progressive shock.  Levophed at max dose and remains on vasopressin.  Lactate greater than 9. Condition does not appear survivable.  Discussed with bedside nurse.  Family aware.  eICU Interventions  DNR Continue present measures Unlikely to survive the evening     Intervention Category Major Interventions: Shock - evaluation and management  Henry Russel, P 11/06/2022, 10:27 PM

## 2022-11-06 NOTE — Consult Note (Addendum)
NAME:  Eileen Nguyen, MRN:  [id number], DOB:  28-Sep-1945, LOS: 4 ADMISSION DATE:  11/23/22, CONSULTATION DATE:  11/06/2022 REFERRING MD:  Elsie Lincoln MD, CHIEF COMPLAINT:     History of Present Illness:   77 year old female with past medical history of hypertension, gastroesophageal reflux disease, advanced dementia.  Admitted with right hip fracture after fall.  Underwent right anterior total hip arthroplasty on 5/3 She has been confused postprocedure and was found today unresponsive, vomiting feculent material. followed by a PEA arrest 20 mins to ROSC and she was given 5 epi, shock x2.  Intubated by the ED  Pertinent  Medical History    has a past medical history of Acid reflux, Degenerative disc disease, lumbar, Hiatal hernia with GERD, Hypertension, and Psoriasis.   Significant Hospital Events: Including procedures, antibiotic start and stop dates in addition to other pertinent events   5/1 admitted after a fall 5/3 right total hip arthroplasty  Interim History / Subjective:    Objective   Blood pressure (!) 125/100, pulse 78, temperature (!) 97.3 F (36.3 C), temperature source Oral, resp. rate 17, height 5\' 2"  (1.575 m), weight 51.1 kg, SpO2 95 %.        Intake/Output Summary (Last 24 hours) at 11/06/2022 1458 Last data filed at 11/06/2022 1200 Gross per 24 hour  Intake 673.33 ml  Output --  Net 673.33 ml   Filed Weights   Nov 23, 2022 2008 11/27/2022 0526 11/05/22 0624  Weight: 48.7 kg 50.4 kg 51.1 kg    Examination: Blood pressure (!) 125/100, pulse 78, temperature (!) 97.3 F (36.3 C), temperature source Oral, resp. rate 17, height 5\' 2"  (1.575 m), weight 51.1 kg, SpO2 95 %. Gen:      No acute distress, frail, elderly HEENT:  EOMI, sclera anicteric Neck:     No masses; no thyromegaly Lungs:    Clear to auscultation bilaterally; normal respiratory effort CV:         Regular rate and rhythm; no murmurs Abd:      + bowel sounds; soft, non-tender; no palpable masses, no  distension Ext:    No edema; adequate peripheral perfusion Skin:      Warm and dry; no rash Neuro: Sedated, unresponsive  Labs/Imaging reviewed For potassium 3.2, BUN/creatinine 31/1.55 AST 41, ALT 20 WBC 19.2, hemoglobin 12.5, platelets 263   Resolved Hospital Problem list     Assessment & Plan:  Cardiac arrest Likely secondary to aspiration Transfer to ICU Check echocardiogram, troponin, lactic acid Currently on Levophed.  Add vasopressin and stress dose steroids  Acute hypoxic respiratory secondary to aspiration Check ABG Full vent support Start Unasyn  Right femur fracture status post hip arthroplasty Management per Ortho  Acute metabolic encephalopathy, baseline dementia May have suffered anoxic injury Supportive care  Moderate protein calorie malnutrition Start tube feeds when she has stabilized  Goals of care Dr. Leafy Half has discussed with husband and she is currently DNR with no CPR in case she arrests again.  Best Practice (right click and "Reselect all SmartList Selections" daily)   Diet/type: NPO DVT prophylaxis: prophylactic heparin  GI prophylaxis: PPI Lines: Central line Foley:  N/A Code Status:  limited Last date of multidisciplinary goals of care discussion []   Critical care time:    The patient is critically ill with multiple organ system failure and requires high complexity decision making for assessment and support, frequent evaluation and titration of therapies, advanced monitoring, review of radiographic studies and interpretation of complex data.   Critical  Care Time devoted to patient care services, exclusive of separately billable procedures, described in this note is 45 minutes.   Chilton Greathouse MD [facility] See Amion for pager  If no response to pager , please call [phone] until 7pm After 7:00 pm call Elink  [phone] 11/06/2022, 4:13 PM

## 2022-11-06 NOTE — Progress Notes (Signed)
Pharmacy Antibiotic Note  Eileen Nguyen is a 77 y.o. female admitted on 11/29/2022 with aspiration PNA.  Pharmacy has been consulted for Unasyn dosing.  Plan: Unasyn 3 mg IV q12h  Height: 5\' 2"  (157.5 cm) Weight: 51.1 kg (112 lb 10.5 oz) IBW/kg (Calculated) : 50.1  Temp (24hrs), Avg:97.3 F (36.3 C), Min:97.3 F (36.3 C), Max:97.3 F (36.3 C)  Recent Labs  Lab 11/26/2022 1634 11/03/22 0806 11/09/2022 1116 11/05/22 0950 11/06/22 0845  WBC 8.3 6.0  --   --  19.2*  CREATININE 0.76 0.73 0.77 0.84 1.55*    Estimated Creatinine Clearance: 24.4 mL/min (A) (by C-G formula based on SCr of 1.55 mg/dL (H)).    Allergies  Allergen Reactions   Beta Adrenergic Blockers Rash and Other (See Comments)    Worsens psoriasis   Codeine Nausea And Vomiting   Amlodipine Nausea Only and Other (See Comments)    Also caused dizziness and had no appetite   Other Other (See Comments)    Per a [facility] doctor, patient was told to "never use any inhalers" (was told it would worsen her psoriasis)    Antimicrobials this admission: 09-Nov-2022 cefazolin > 5/4 (peri-op px) 5/5 unasyn>> Dose adjustments this admission:  Microbiology results: 5/2 MRSA - 5/2 MSSA - 5/1 UCx > 100 K E coli pan sens F   Thank you for allowing pharmacy to be a part of this patient's care.  Herby Abraham, Pharm.D Use secure chat for questions 11/06/2022 5:16 PM

## 2022-11-06 NOTE — Plan of Care (Signed)
Plan of care reviewed. Problem: Education: Goal: Knowledge of General Education information will improve Description: Including pain rating scale, medication(s)/side effects and non-pharmacologic comfort measures Outcome: Progressing   Problem: Health Behavior/Discharge Planning: Goal: Ability to manage health-related needs will improve Outcome: Progressing   Problem: Clinical Measurements: Goal: Ability to maintain clinical measurements within normal limits will improve Outcome: Progressing Goal: Will remain free from infection Outcome: Progressing Goal: Diagnostic test results will improve Outcome: Progressing Goal: Respiratory complications will improve Outcome: Progressing Goal: Cardiovascular complication will be avoided Outcome: Progressing   Problem: Activity: Goal: Risk for activity intolerance will decrease Outcome: Progressing   Problem: Nutrition: Goal: Adequate nutrition will be maintained Outcome: Progressing   Problem: Elimination: Goal: Will not experience complications related to bowel motility Outcome: Progressing Goal: Will not experience complications related to urinary retention Outcome: Progressing   Problem: Pain Managment: Goal: General experience of comfort will improve Outcome: Progressing

## 2022-11-07 ENCOUNTER — Inpatient Hospital Stay (HOSPITAL_COMMUNITY): Payer: 59

## 2022-11-07 ENCOUNTER — Other Ambulatory Visit (HOSPITAL_COMMUNITY): Payer: 59

## 2022-11-07 DIAGNOSIS — I468 Cardiac arrest due to other underlying condition: Secondary | ICD-10-CM | POA: Diagnosis not present

## 2022-11-07 DIAGNOSIS — J989 Respiratory disorder, unspecified: Secondary | ICD-10-CM | POA: Diagnosis not present

## 2022-11-07 LAB — BLOOD GAS, ARTERIAL
Acid-base deficit: 6.9 mmol/L — ABNORMAL HIGH (ref 0.0–2.0)
Bicarbonate: 20.6 mmol/L (ref 20.0–28.0)
Drawn by: 59133
FIO2: 100 %
MECHVT: 400 mL
O2 Saturation: 89.2 %
Patient temperature: 37.2
RATE: 24 resp/min
pCO2 arterial: 48 mmHg (ref 32–48)
pH, Arterial: 7.24 — ABNORMAL LOW (ref 7.35–7.45)
pO2, Arterial: 59 mmHg — ABNORMAL LOW (ref 83–108)

## 2022-11-07 LAB — CBC
HCT: 31.7 % — ABNORMAL LOW (ref 36.0–46.0)
Hemoglobin: 9.6 g/dL — ABNORMAL LOW (ref 12.0–15.0)
MCH: 29.1 pg (ref 26.0–34.0)
MCHC: 30.3 g/dL (ref 30.0–36.0)
MCV: 96.1 fL (ref 80.0–100.0)
Platelets: 106 10*3/uL — ABNORMAL LOW (ref 150–400)
RBC: 3.3 MIL/uL — ABNORMAL LOW (ref 3.87–5.11)
RDW: 13.9 % (ref 11.5–15.5)
WBC: 9.4 10*3/uL (ref 4.0–10.5)
nRBC: 1.3 % — ABNORMAL HIGH (ref 0.0–0.2)

## 2022-11-07 LAB — CBC WITH DIFFERENTIAL/PLATELET
Abs Immature Granulocytes: 0.11 10*3/uL — ABNORMAL HIGH (ref 0.00–0.07)
Basophils Absolute: 0 10*3/uL (ref 0.0–0.1)
Basophils Relative: 1 %
Eosinophils Absolute: 0.1 10*3/uL (ref 0.0–0.5)
Eosinophils Relative: 1 %
HCT: 32.2 % — ABNORMAL LOW (ref 36.0–46.0)
Hemoglobin: 9.7 g/dL — ABNORMAL LOW (ref 12.0–15.0)
Immature Granulocytes: 1 %
Lymphocytes Relative: 14 %
Lymphs Abs: 1.1 10*3/uL (ref 0.7–4.0)
MCH: 29 pg (ref 26.0–34.0)
MCHC: 30.1 g/dL (ref 30.0–36.0)
MCV: 96.4 fL (ref 80.0–100.0)
Monocytes Absolute: 0.5 10*3/uL (ref 0.1–1.0)
Monocytes Relative: 6 %
Neutro Abs: 6.2 10*3/uL (ref 1.7–7.7)
Neutrophils Relative %: 77 %
Platelets: 115 10*3/uL — ABNORMAL LOW (ref 150–400)
RBC: 3.34 MIL/uL — ABNORMAL LOW (ref 3.87–5.11)
RDW: 13.9 % (ref 11.5–15.5)
WBC: 8 10*3/uL (ref 4.0–10.5)
nRBC: 1.3 % — ABNORMAL HIGH (ref 0.0–0.2)

## 2022-11-07 LAB — COMPREHENSIVE METABOLIC PANEL
ALT: 1674 U/L — ABNORMAL HIGH (ref 0–44)
AST: 3778 U/L — ABNORMAL HIGH (ref 15–41)
Albumin: 1.6 g/dL — ABNORMAL LOW (ref 3.5–5.0)
Alkaline Phosphatase: 75 U/L (ref 38–126)
Anion gap: 19 — ABNORMAL HIGH (ref 5–15)
BUN: 41 mg/dL — ABNORMAL HIGH (ref 8–23)
CO2: 20 mmol/L — ABNORMAL LOW (ref 22–32)
Calcium: 7.3 mg/dL — ABNORMAL LOW (ref 8.9–10.3)
Chloride: 95 mmol/L — ABNORMAL LOW (ref 98–111)
Creatinine, Ser: 2.32 mg/dL — ABNORMAL HIGH (ref 0.44–1.00)
GFR, Estimated: 21 mL/min — ABNORMAL LOW (ref >60)
Glucose, Bld: 269 mg/dL — ABNORMAL HIGH (ref 70–99)
Potassium: 3.7 mmol/L (ref 3.5–5.1)
Sodium: 134 mmol/L — ABNORMAL LOW (ref 135–145)
Total Bilirubin: 1.7 mg/dL — ABNORMAL HIGH (ref 0.3–1.2)
Total Protein: 3.5 g/dL — ABNORMAL LOW (ref 6.5–8.1)

## 2022-11-07 LAB — GLUCOSE, CAPILLARY
Glucose-Capillary: 254 mg/dL — ABNORMAL HIGH (ref 70–99)
Glucose-Capillary: 225 mg/dL — ABNORMAL HIGH (ref 70–99)

## 2022-11-07 LAB — LACTIC ACID, PLASMA: Lactic Acid, Venous: >9 mmol/L (ref 0.5–1.9)

## 2022-11-07 LAB — PROTIME-INR
INR: 3.7 — ABNORMAL HIGH (ref 0.8–1.2)
Prothrombin Time: 36.1 seconds — ABNORMAL HIGH (ref 11.4–15.2)

## 2022-11-07 MED ORDER — FENTANYL 2500MCG IN NS 250ML (10MCG/ML) PREMIX INFUSION
0.0000 ug/h | INTRAVENOUS | Status: DC
  Administered 2022-11-07: 50 ug/h via INTRAVENOUS
  Filled 2022-11-07: qty 250

## 2022-11-07 MED ORDER — ACETAMINOPHEN 650 MG RE SUPP: 650.0000 mg | Freq: Four times a day (QID) | RECTAL | Status: DC | PRN

## 2022-11-07 MED ORDER — NOREPINEPHRINE 4 MG/250ML-% IV SOLN
INTRAVENOUS | Status: AC
  Filled 2022-11-07: qty 250

## 2022-11-07 MED ORDER — FENTANYL BOLUS VIA INFUSION
100.0000 ug | INTRAVENOUS | Status: DC | PRN
  Administered 2022-11-07: 100 ug via INTRAVENOUS

## 2022-11-07 MED ORDER — GLYCOPYRROLATE 1 MG PO TABS: 1.0000 mg | ORAL_TABLET | ORAL | Status: DC | PRN

## 2022-11-07 MED ORDER — ACETAMINOPHEN 325 MG PO TABS: 650.0000 mg | ORAL_TABLET | Freq: Four times a day (QID) | ORAL | Status: DC | PRN

## 2022-11-07 MED ORDER — MIDAZOLAM HCL 2 MG/2ML IJ SOLN
2.0000 mg | INTRAMUSCULAR | Status: DC | PRN
  Administered 2022-11-07: 2 mg via INTRAVENOUS
  Filled 2022-11-07: qty 4

## 2022-11-07 MED ORDER — GLYCOPYRROLATE 0.2 MG/ML IJ SOLN
0.2000 mg | INTRAMUSCULAR | Status: DC | PRN
  Filled 2022-11-07: qty 1

## 2022-11-07 MED ORDER — GLYCOPYRROLATE 0.2 MG/ML IJ SOLN: 0.2000 mg | INTRAMUSCULAR | Status: DC | PRN

## 2022-11-07 MED ORDER — POLYVINYL ALCOHOL 1.4 % OP SOLN: 1.0000 [drp] | Freq: Four times a day (QID) | OPHTHALMIC | Status: DC | PRN

## 2022-11-09 ENCOUNTER — Encounter (HOSPITAL_COMMUNITY): Payer: Self-pay | Admitting: Orthopaedic Surgery

## 2022-12-03 NOTE — Progress Notes (Signed)
Post-Mortem Complete. PT transferred to the morgue by myself and another RN via gurney. PT is ME case, pt signed into morgue.

## 2022-12-03 NOTE — Progress Notes (Signed)
NAME:  Eileen Nguyen, MRN:  [id number], DOB:  [birth year], LOS: 5 ADMISSION DATE:  11/26/2022, CONSULTATION DATE:  11/06/2022 REFERRING MD:  Elsie Lincoln MD, CHIEF COMPLAINT:     History of Present Illness:   77 year old female with past medical history of hypertension, gastroesophageal reflux disease, advanced dementia.  Admitted with right hip fracture after fall.  Underwent right anterior total hip arthroplasty on 11/25/22 She has been confused postprocedure and was found today unresponsive, vomiting feculent material. followed by a PEA arrest 20 mins to ROSC and she was given 5 epi, shock x2.  Intubated by the ED  Pertinent  Medical History    has a past medical history of Acid reflux, Degenerative disc disease, lumbar, Hiatal hernia with GERD, Hypertension, and Psoriasis.   Significant Hospital Events: Including procedures, antibiotic start and stop dates in addition to other pertinent events   5/1 admitted after a fall 2022/11/25 right total hip arthroplasty 5/6 Progressive multisystem organ dysfunction despite aggressive interventions   Interim History / Subjective:  Sedated on vent   Objective   Blood pressure (!) 108/49, pulse (!) 126, temperature (!) 96.8 F (36 C), resp. rate (!) 24, height 5\' 2"  (1.575 m), weight 51.3 kg, SpO2 (!) 82 %.    Vent Mode: PRVC FiO2 (%):  [100 %] 100 % Set Rate:  [24 bmp] 24 bmp Vt Set:  [400 mL] 400 mL PEEP:  [5 cmH20] 5 cmH20 Plateau Pressure:  [11 cmH20-17 cmH20] 17 cmH20   Intake/Output Summary (Last 24 hours) at 11/06/2022 0700 Last data filed at 11/12/2022 8657 Gross per 24 hour  Intake 3891.3 ml  Output 500 ml  Net 3391.3 ml    Filed Weights   11/25/2022 0526 11/05/22 0624 11/10/2022 0500  Weight: 50.4 kg 51.1 kg 51.3 kg    Examination: General: Acute on chronically ill appearing elderly female lying in bed on mechanical ventilation, in NAD HEENT: ETT, MM pink/moist, PERRL,  Neuro: Unresponsive on vent CV: s1s2 regular rate and rhythm, no  murmur, rubs, or gallops,  PULM:  Clear to auscultation, no increased work of breathing, tolerating vent  GI: soft, bowel sounds active in all 4 quadrants, non-tender, non-distendedd  Extremities: warm/dry, no edema  Skin: no rashes or lesions  Resolved Hospital Problem list     Assessment & Plan:  In hospital cardiac arrest -Likely secondary to aspiration, 20 mins to ROSC and she was given 5 epi, shock x2.  Acute hypoxic respiratory secondary to aspiration Right femur fracture status post hip arthroplasty Acute metabolic encephalopathy, baseline dementia Moderate protein calorie malnutrition Overnight 5/5 patient continued to decline despite aggressive interventions, family has decided to transition to comfort care. Family has now visited and are ready to proceed with transition  P: Orders placed for comfort care    Best Practice (right click and "Reselect all SmartList Selections" daily)   Diet/type: NPO DVT prophylaxis: prophylactic heparin  GI prophylaxis: PPI Lines: Central line Foley:  N/A Code Status:  limited Last date of multidisciplinary goals of care discussion []   Critical care time:    Performed by: Honi Name D. Harris  Total critical care time: 42 minutes  Critical care time was exclusive of separately billable procedures and treating other patients.  Critical care was necessary to treat or prevent imminent or life-threatening deterioration.  Critical care was time spent personally by me on the following activities: development of treatment plan with patient and/or surrogate as well as nursing, discussions with consultants, evaluation of patient's response to  treatment, examination of patient, obtaining history from patient or surrogate, ordering and performing treatments and interventions, ordering and review of laboratory studies, ordering and review of radiographic studies, pulse oximetry and re-evaluation of patient's condition.  Hudson Lehmkuhl D. Harris,  NP-C [facility] Personal contact information can be found on Amion  If no contact or response made please call [phone] 11/04/2022, 12:24 PM

## 2022-12-03 NOTE — Progress Notes (Signed)
Per electronic order pt extubated to RA at 1238

## 2022-12-03 NOTE — Progress Notes (Signed)
Spoke with family. Informed family that patient's condition continues to worsen, explained the problems the patient is dealing with, and the poor prognosis expected. Family informed that despite best efforts, medications and ventilation are unable to support life. Son Jorja Loa, who is next of kin, requesting to withdraw care to let his "mother pass peacefully." Informed the son that the doctor will be calling for confirmation which he verbalized understanding. Elink made aware and is passing to dayshift. Also spoke with honor bridge, who states patient is not an immediate rule out for donation, and states a representative will be by possibly today.

## 2022-12-03 NOTE — Progress Notes (Signed)
Nutrition Brief Note  Chart reviewed. Family requesting withdraw in care due to patient's worsening conditions. No further nutrition interventions planned at this time.  Please re-consult as needed.   Leodis Rains, RDN, LDN  Clinical Nutrition

## 2022-12-03 NOTE — Progress Notes (Signed)
eLink Physician-Brief Progress Note Patient Name: Eileen Nguyen DOB: 12-27-1945 MRN: [id number]   Date of Service  11/06/2022  HPI/Events of Note  Nurse reports patient having significant amount of bloody liquid stool.  She remains on levophed but needs did not increase.  HGB at 0400 9.7  eICU Interventions  Repeat hgb now Coag profile now Continue protonix IV BID     Intervention Category Intermediate Interventions: Bleeding - evaluation and treatment with blood products  Henry Russel, P 11/06/2022, 6:03 AM

## 2022-12-03 NOTE — Progress Notes (Signed)
Physical Therapy Discharge Patient Details Name: ADILENY ZAVADA MRN: [id number] DOB: 1945-07-16 Today's Date: 11/08/2022 Time:  -     Patient discharged from PT services secondary to medical decline - will need to re-order PT to resume therapy services.  Please see latest therapy progress note for current level of functioning and progress toward goals.   Blanchard Kelch PT Acute Rehabilitation Services Office [phone] Weekend pager-[phone]    GP     Rada Hay 11/23/2022, 7:19 AM

## 2022-12-03 NOTE — Progress Notes (Signed)
Patient's OG noted to stop pulling to suction. Difficulty flushing and pulled. Discussed with MD and exchanged tube for a larger one. Abd xray completed for placement verification, waiting results.

## 2022-12-03 NOTE — Death Summary Note (Signed)
DEATH SUMMARY   Patient Details  Name: Eileen Nguyen MRN: [id number] DOB: April 11, 1946  Admission/Discharge Information   Admit Date:  Nov 17, 2022  Date of Death: Date of Death: 11/22/2022  Time of Death: Time of Death: 12/08/50 (called to [employer])  Length of Stay: 5  Referring Physician: Pcp, No   Reason(s) for Hospitalization  Mechanical fall resulting in right hip fracture   Diagnoses  Preliminary cause of death: Multisystem organ dysfunction in the setting of in-hospital cardiac arrest due to likely septic shock   Secondary Diagnoses (including complications and co-morbidities):  Principal Problem:   Cardiac arrest due to respiratory disorder (HCC) Active Problems:   GERD (gastroesophageal reflux disease)   Essential hypertension   Hypokalemia   Goals of care, counseling/discussion   Fall at home, initial encounter   Elevated CPK   Closed subcapital fracture of neck of femur, right, initial encounter (HCC)   Protein-calorie malnutrition, moderate (HCC)   Dementia with behavioral disturbance (HCC)   Vitamin D deficiency   Brief Hospital Course (including significant findings, care, treatment, and services provided and events leading to death)  Eileen Nguyen is a 77 y.o. year old female who 77 year old female with past medical history of hypertension, gastroesophageal reflux disease, and advanced dementia who presented to the ED at [facility] Nov 17, 2022 with right hip fracture after suffering a mechanical fall at home.  She was admitted per the hospitalist team. On 5/3 patient underwent right direct anterior hip arthroplasty and tolerated procedure well. By evening of 5/4 patient became more agitated and confused with "frequently putting fingers down her throat to make herself vomit." Later that night/early am of 5/5 she suffered prolonged cardiac arrest with 20 min downtime prior to ROSC. She was then transferred to the ICU for ongoing care and further workup. Concern for aspiration  event that lead to cardiac arrest. She remained in ICU intubated on pressors support with progressive decline until 11/22/22 when family made the decision transition to comfort care. She was compassionately extubated and passes at 1252   Pertinent Labs and Studies  Significant Diagnostic Studies DG Abd Portable 1V  Result Date: 11/22/22 CLINICAL DATA:  OG tube placement EXAM: PORTABLE ABDOMEN - 1 VIEW COMPARISON:  11/06/2022. FINDINGS: OG tube is in the fundus of the stomach. Decreasing gaseous distention of the stomach. IMPRESSION: OG tube in the fundus of the stomach. Electronically Signed   By: Charlett Nose M.D.   On: 22-Nov-2022 01:30   DG Abd 1 View  Result Date: 11/06/2022 CLINICAL DATA:  Cardiac arrest.  Nasogastric tube placement. EXAM: ABDOMEN - 1 VIEW COMPARISON:  CT, 07/29/2020. FINDINGS: Paucity of bowel gas.  No bowel dilation to suggest obstruction. Stomach is significantly distended with air. Nasal/orogastric tube passes well below the diaphragm, tip in the mid stomach. IMPRESSION: 1. Well-positioned nasogastric tube. Stomach significantly distended with air. Electronically Signed   By: Amie Portland M.D.   On: 11/06/2022 16:26   DG Chest 1 View  Result Date: 11/06/2022 CLINICAL DATA:  Respiratory failure. EXAM: CHEST  1 VIEW COMPARISON:  2022/11/17. FINDINGS: Endotracheal tube tip, 3.2 cm above the carina. Nasal/orogastric tube passes well below the diaphragm into the mid stomach. Stomach is distended with air. Left internal jugular central venous line tip projects in the lower superior vena cava. Cardiac silhouette normal in size.  No mediastinal or hilar masses. Hazy airspace opacities in the lower lungs, right greater than left. No convincing pleural effusion or pneumothorax. Apparent nondisplaced fracture of the left lateral  sixth rib. Skeletal structures are demineralized. IMPRESSION: 1. Well-positioned endotracheal tube, left internal jugular central venous line and nasal/orogastric  tube. 2. New hazy airspace opacities in the lower lungs which may reflect infection, atelectasis or asymmetric edema. Atelectasis favored. History reports cardiac arrest. 3. Left lateral sixth rib fracture, nondisplaced. 4. No pneumothorax. Electronically Signed   By: Amie Portland M.D.   On: 11/06/2022 16:24   DG Pelvis Portable  Result Date: 11/28/2022 CLINICAL DATA:  Status post right total hip arthroplasty EXAM: PORTABLE PELVIS 1-2 VIEWS COMPARISON:  11/29/2022 FINDINGS: Surgical changes of right total hip arthroplasty are identified. Arthroplasty components overlie the expected position. No unexpected fracture or dislocation. Osseous structures are diffusely osteopenic. Limited evaluation of the left hip is unremarkable. Mild degenerative changes are seen within the visualized lumbar spine and sacroiliac joints bilaterally. Subcutaneous gas, surgical dressing, surgical skin staples overlie the right hip. IMPRESSION: 1. Right total hip arthroplasty. No unexpected fracture or dislocation. Electronically Signed   By: Helyn Numbers M.D.   On: 11/25/2022 19:00   DG HIP UNILAT WITH PELVIS 1V RIGHT  Result Date: 11/12/2022 CLINICAL DATA:  Elective surgery. EXAM: DG HIP (WITH OR WITHOUT PELVIS) 1V RIGHT COMPARISON:  Preoperative imaging. FINDINGS: Three fluoroscopic spot views of the pelvis and right hip obtained in the operating room. Interval right hip arthroplasty. Fluoroscopy time 13 seconds. Dose 1.4941 mGy. IMPRESSION: Intraoperative fluoroscopy during right hip arthroplasty. Electronically Signed   By: Narda Rutherford M.D.   On: 11/05/2022 17:27   DG C-Arm 1-60 Min-No Report  Result Date: 11/16/2022 Fluoroscopy was utilized by the requesting physician.  No radiographic interpretation.   CT PELVIS WO CONTRAST  Result Date: 11/06/2022 CLINICAL DATA:  Trauma, fall EXAM: CT PELVIS WITHOUT CONTRAST TECHNIQUE: Multidetector CT imaging of the pelvis was performed following the standard protocol without  intravenous contrast. RADIATION DOSE REDUCTION: This exam was performed according to the departmental dose-optimization program which includes automated exposure control, adjustment of the mA and/or kV according to patient size and/or use of iterative reconstruction technique. COMPARISON:  CT abdomen and pelvis done on 07/29/2020 FINDINGS: Urinary Tract: Diverticulum is noted in the left lateral margin of the urinary bladder with no significant change. Bowel:  Visualized bowel loops are unremarkable. Vascular/Lymphatic: Scattered arterial calcifications are seen. Reproductive:  Unremarkable. Other:  There is no ascites or pneumoperitoneum in pelvis. Musculoskeletal: There is recent impacted fracture in subcapital portion of neck of right femur. There is no dislocation. Degenerative changes with bony spurs are noted in left hip. SI joints are symmetrical. Degenerative changes are noted in the pubic symphysis with bony spurs. Degenerative changes are noted in the visualized lower lumbar spine with bony spurs and facet hypertrophy. IMPRESSION: Recent impacted fracture is seen in subcapital portion of neck of right femur. There is no dislocation. Degenerative changes are noted in left hip and lumbar spine. Electronically Signed   By: Ernie Avena M.D.   On: 11/06/2022 18:20   DG Pelvis Portable  Result Date: 11/09/2022 CLINICAL DATA:  Found on floor with swelling and bruising to the left knee. EXAM: PORTABLE PELVIS 1-2 VIEWS COMPARISON:  CT abdomen and pelvis 07/29/2020 FINDINGS: Widening of the right SI joint. Question of acute fracture of the right iliac wing versus projection artifact. Additional cortical irregularity and subtle lucency through the right femoral head neck junction suspicious for minimally displaced impacted fracture. IMPRESSION: 1. Widening of the right SI joint with questionable right iliac wing fracture. 2. Possible additional minimally displaced and impacted right  subcapital femoral neck  fracture. 3. CT is recommended to further evaluate these findings. Electronically Signed   By: Minerva Fester M.D.   On: 11/10/2022 17:40   DG Knee Complete 4 Views Left  Result Date: 11/25/2022 CLINICAL DATA:  Fall and left knee pain. EXAM: LEFT KNEE - COMPLETE 4+ VIEW COMPARISON:  None Available. FINDINGS: There is no acute fracture or dislocation. The bones are osteopenic. There is arthritic change of the knee with tricompartmental narrowing most severely involving the lateral compartment. Small suprapatellar effusion. The soft tissues are unremarkable. IMPRESSION: 1. No acute fracture or dislocation. 2. Osteopenia with osteoarthritis. Electronically Signed   By: Elgie Collard M.D.   On: 11/25/2022 17:35   DG Chest Port 1 View  Result Date: 11/21/2022 CLINICAL DATA:  Neighbor found patient lying on the floor. Swelling and bruising to left knee. Fall. EXAM: PORTABLE CHEST 1 VIEW COMPARISON:  05/15/2018 FINDINGS: Limited evaluation of the cardiomediastinal silhouette given patient rotation however this appears grossly similar to 05/15/2018. Aortic atherosclerotic calcification. No focal consolidation, pleural effusion, or pneumothorax. No displaced rib fractures. Breast implants. IMPRESSION: No acute cardiopulmonary disease. Electronically Signed   By: Minerva Fester M.D.   On: 12/01/2022 17:35   CT Cervical Spine Wo Contrast  Result Date: 11/28/2022 CLINICAL DATA:  Neck trauma.  Found on floor. EXAM: CT CERVICAL SPINE WITHOUT CONTRAST TECHNIQUE: Multidetector CT imaging of the cervical spine was performed without intravenous contrast. Multiplanar CT image reconstructions were also generated. RADIATION DOSE REDUCTION: This exam was performed according to the departmental dose-optimization program which includes automated exposure control, adjustment of the mA and/or kV according to patient size and/or use of iterative reconstruction technique. COMPARISON:  Cervical spine CT 07/29/2020 FINDINGS:  Alignment: Unchanged trace anterolisthesis of C5 on C6. Skull base and vertebrae: No acute fracture or suspicious osseous lesion. Soft tissues and spinal canal: No prevertebral fluid or swelling. No visible canal hematoma. Disc levels: Similar appearance of multilevel disc degeneration, moderate at C4-5. Upper chest: No mass or consolidation in the included lung apices. Other: 1.3 cm right thyroid nodule for which no follow-up imaging is recommended. Mild calcific atherosclerosis at the carotid bifurcations. IMPRESSION: No acute cervical spine fracture or traumatic malalignment. Electronically Signed   By: Sebastian Ache M.D.   On: 11/05/2022 16:33   CT Head Wo Contrast  Result Date: 11/25/2022 CLINICAL DATA:  Trauma EXAM: CT HEAD WITHOUT CONTRAST TECHNIQUE: Contiguous axial images were obtained from the base of the skull through the vertex without intravenous contrast. RADIATION DOSE REDUCTION: This exam was performed according to the departmental dose-optimization program which includes automated exposure control, adjustment of the mA and/or kV according to patient size and/or use of iterative reconstruction technique. COMPARISON:  Head CT 07/29/2020.  MRI head 02/18/2013. FINDINGS: Brain: No evidence of acute infarction, hemorrhage, hydrocephalus, or extra-axial collection. Calcified extra-axial rounded mass in the right parietal region measures 12 mm and appears unchanged previously characterized as meningioma. Cerebellar atrophy again noted. Vascular: Atherosclerotic calcifications are present within the cavernous internal carotid arteries. Skull: Normal. Negative for fracture or focal lesion. Sinuses/Orbits: There is opacification of the right maxillary sinus. Orbits are within normal limits. Other: None. IMPRESSION: 1. No acute intracranial process. 2. Stable calcified extra-axial mass in the right parietal region, previously characterized as meningioma. 3. Right maxillary sinus disease. Electronically  Signed   By: Darliss Cheney M.D.   On: 11/19/2022 16:28    Microbiology Recent Results (from the past 240 hour(s))  Urine Culture (for pregnant,  neutropenic or urologic patients or patients with an indwelling urinary catheter)     Status: Abnormal   Collection Time: 11/15/2022  4:34 PM   Specimen: Urine, Clean Catch  Result Value Ref Range Status   Specimen Description   Final    URINE, CLEAN CATCH Performed at [facility], [address], [place], [place] [address]    Special Requests   Final    NONE Performed at [facility], [address], [place], [place] [address]    Culture >=100,000 COLONIES/mL ESCHERICHIA COLI (A)  Final   Report Status 11/17/2022 FINAL  Final   Organism ID, Bacteria ESCHERICHIA COLI (A)  Final      Susceptibility   Escherichia coli - MIC*    AMPICILLIN 4 SENSITIVE Sensitive     CEFAZOLIN <=4 SENSITIVE Sensitive     CEFEPIME <=0.12 SENSITIVE Sensitive     CEFTRIAXONE <=0.25 SENSITIVE Sensitive     CIPROFLOXACIN <=0.25 SENSITIVE Sensitive     GENTAMICIN <=1 SENSITIVE Sensitive     IMIPENEM <=0.25 SENSITIVE Sensitive     NITROFURANTOIN <=16 SENSITIVE Sensitive     TRIMETH/SULFA <=20 SENSITIVE Sensitive     AMPICILLIN/SULBACTAM <=2 SENSITIVE Sensitive     PIP/TAZO <=4 SENSITIVE Sensitive     * >=100,000 COLONIES/mL ESCHERICHIA COLI  Surgical pcr screen     Status: None   Collection Time: 11/03/22 12:47 AM   Specimen: Nasal Mucosa; Nasal Swab  Result Value Ref Range Status   MRSA, PCR NEGATIVE NEGATIVE Final   Staphylococcus aureus NEGATIVE NEGATIVE Final    Comment: (NOTE) The Xpert SA Assay (FDA approved for NASAL specimens in patients 40 years of age and older), is one component of a comprehensive surveillance program. It is not intended to diagnose infection nor to guide or monitor treatment. Performed at [facility], [address], [place], [place] [address]   MRSA Next Gen by PCR, Nasal      Status: Abnormal   Collection Time: 11/06/22  8:34 PM   Specimen: Nasal Mucosa; Nasal Swab  Result Value Ref Range Status   MRSA by PCR Next Gen DETECTED (A) NOT DETECTED Final    Comment: CRITICAL RESULT CALLED TO, READ BACK BY AND VERIFIED WITH: Riley Churches RN @ [phone] 11/06/22. GILBERTL Performed at [facility], [address] [place], [place] [address]     Lab Basic Metabolic Panel: Recent Labs  Lab 11/03/22 0806 11/13/2022 1116 11/05/22 0950 11/06/22 0845 11/06/22 1650 11/06/22 2034 11/05/2022 0405  NA 137 143 139 142 144 142 134*  K 3.1* 3.1* 3.5 3.2* 3.4* 3.8 3.7  CL 105 103 99 98 111 103 95*  CO2 24 29 29  32 15* 16* 20*  GLUCOSE 90 130* 146* 137* 192* 186* 269*  BUN 9 13 18  31* 34* 39* 41*  CREATININE 0.73 0.77 0.84 1.55* 1.71* 2.07* 2.32*  CALCIUM 9.0 9.3 8.9 9.4 6.1* 8.0* 7.3*  MG 2.3 2.4 2.3 2.5* 2.4  --   --    Liver Function Tests: Recent Labs  Lab 11/05/22 0950 11/06/22 0845 11/06/22 1650 11/06/22 2034 11/08/2022 0405  AST 25 41 2,690* 3,775* 3,778*  ALT 16 20 1,263* 1,480* 1,674*  ALKPHOS 46 49 55 64 75  BILITOT 0.6 0.9 1.5* 1.8* 1.7*  PROT 6.0* 6.5 3.7* 3.7* 3.5*  ALBUMIN 3.2* 3.1* 1.5* 1.6* 1.6*   No results for input(s): "LIPASE", "AMYLASE" in the last 168 hours. No results for input(s): "AMMONIA" in the last  168 hours. CBC: Recent Labs  Lab 11-08-22 1634 11/03/22 0806 11/06/22 0845 11/06/22 1650 11/06/22 2034 11/24/2022 0405 11/20/2022 0644  WBC 8.3 6.0 19.2* 7.2 4.9 8.0 9.4  NEUTROABS 6.2 4.2 15.8* 5.1  --  6.2  --   HGB 13.9 12.8 12.5 9.1* 9.8* 9.7* 9.6*  HCT 43.7 40.1 40.7 29.9* 32.7* 32.2* 31.7*  MCV 92.2 90.5 92.5 96.5 97.9 96.4 96.1  PLT 176 186 263 150 149* 115* 106*   Cardiac Enzymes: Recent Labs  Lab Nov 08, 2022 1634 11/03/22 0806  CKTOTAL 448* 348*   Sepsis Labs: Recent Labs  Lab 11/06/22 1650 11/06/22 2034 11/10/2022 0405 11/03/2022 0644  WBC 7.2 4.9 8.0 9.4  LATICACIDVEN >9.0* >9.0* >9.0*  --      Kimaria Struthers D. Harris 11/25/2022, 5:29 PM

## 2022-12-03 DEATH — deceased

## 2023-01-02 DEATH — deceased
